# Patient Record
Sex: Male | Born: 1950 | Race: White | Hispanic: No | Smoking: Never smoker
Health system: Southern US, Community
[De-identification: ages and names within clinical notes are randomized; demographics above are authoritative.]

## PROBLEM LIST (undated history)

## (undated) DIAGNOSIS — I10 Essential (primary) hypertension: Secondary | ICD-10-CM

## (undated) DIAGNOSIS — E119 Type 2 diabetes mellitus without complications: Secondary | ICD-10-CM

## (undated) DIAGNOSIS — R609 Edema, unspecified: Secondary | ICD-10-CM

## (undated) DIAGNOSIS — H409 Unspecified glaucoma: Secondary | ICD-10-CM

## (undated) DIAGNOSIS — E7800 Pure hypercholesterolemia, unspecified: Secondary | ICD-10-CM

## (undated) DIAGNOSIS — G20 Parkinson's disease: Secondary | ICD-10-CM

## (undated) DIAGNOSIS — J449 Chronic obstructive pulmonary disease, unspecified: Secondary | ICD-10-CM

## (undated) DIAGNOSIS — B351 Tinea unguium: Secondary | ICD-10-CM

## (undated) DIAGNOSIS — K2970 Gastritis, unspecified, without bleeding: Secondary | ICD-10-CM

## (undated) HISTORY — DX: Parkinson's disease: G20

## (undated) HISTORY — DX: Tinea unguium: B35.1

## (undated) HISTORY — DX: Gastritis, unspecified, without bleeding: K29.70

## (undated) HISTORY — DX: Unspecified glaucoma: H40.9

## (undated) HISTORY — DX: Pure hypercholesterolemia, unspecified: E78.00

## (undated) HISTORY — DX: Edema, unspecified: R60.9

## (undated) HISTORY — PX: HIP SURGERY: SHX245

## (undated) HISTORY — PX: BACK SURGERY: SHX140

## (undated) HISTORY — DX: Essential (primary) hypertension: I10

## (undated) HISTORY — DX: Type 2 diabetes mellitus without complications: E11.9

## (undated) HISTORY — DX: Chronic obstructive pulmonary disease, unspecified: J44.9

---

## 2005-11-12 ENCOUNTER — Inpatient Hospital Stay: Payer: Self-pay | Admitting: Internal Medicine

## 2005-11-12 ENCOUNTER — Other Ambulatory Visit: Payer: Self-pay

## 2007-08-16 ENCOUNTER — Other Ambulatory Visit: Payer: Self-pay

## 2007-08-16 ENCOUNTER — Emergency Department: Payer: Self-pay | Admitting: Emergency Medicine

## 2010-05-11 ENCOUNTER — Ambulatory Visit: Payer: Self-pay | Admitting: Internal Medicine

## 2011-04-18 ENCOUNTER — Inpatient Hospital Stay: Payer: Self-pay | Admitting: Internal Medicine

## 2011-04-18 LAB — URINALYSIS, COMPLETE
Bacteria: NONE SEEN
Bilirubin,UR: NEGATIVE
Glucose,UR: NEGATIVE mg/dL (ref 0–75)
Ketone: NEGATIVE
Leukocyte Esterase: NEGATIVE
Nitrite: NEGATIVE
Ph: 5 (ref 4.5–8.0)
RBC,UR: <1 /HPF (ref 0–5)
Squamous Epithelial: NONE SEEN
WBC UR: NONE SEEN /HPF (ref 0–5)

## 2011-04-18 LAB — COMPREHENSIVE METABOLIC PANEL
Albumin: 3.8 g/dL (ref 3.4–5.0)
Alkaline Phosphatase: 44 U/L — ABNORMAL LOW (ref 50–136)
Anion Gap: 7 (ref 7–16)
BUN: 19 mg/dL — ABNORMAL HIGH (ref 7–18)
Calcium, Total: 9.3 mg/dL (ref 8.5–10.1)
Chloride: 98 mmol/L (ref 98–107)
Creatinine: 1.39 mg/dL — ABNORMAL HIGH (ref 0.60–1.30)
EGFR (African American): >60
EGFR (Non-African Amer.): 54 — ABNORMAL LOW
Glucose: 171 mg/dL — ABNORMAL HIGH (ref 65–99)
Osmolality: 282 (ref 275–301)
Sodium: 138 mmol/L (ref 136–145)
Total Protein: 7.7 g/dL (ref 6.4–8.2)

## 2011-04-18 LAB — CBC
HGB: 13.9 g/dL (ref 13.0–18.0)
MCH: 29.4 pg (ref 26.0–34.0)
MCV: 89 fL (ref 80–100)
Platelet: 126 10*3/uL — ABNORMAL LOW (ref 150–440)
RBC: 4.71 10*6/uL (ref 4.40–5.90)
WBC: 9 10*3/uL (ref 3.8–10.6)

## 2011-04-18 LAB — CK TOTAL AND CKMB (NOT AT ARMC)
CK, Total: 182 U/L (ref 35–232)
CK, Total: 165 U/L (ref 35–232)
CK-MB: 6.7 ng/mL — ABNORMAL HIGH (ref 0.5–3.6)
CK-MB: 5.4 ng/mL — ABNORMAL HIGH (ref 0.5–3.6)

## 2011-04-19 LAB — CBC WITH DIFFERENTIAL/PLATELET
Basophil #: 0 10*3/uL (ref 0.0–0.1)
Basophil %: 0.1 %
Eosinophil %: 4.7 %
HGB: 12.9 g/dL — ABNORMAL LOW (ref 13.0–18.0)
MCH: 29.3 pg (ref 26.0–34.0)
Monocyte #: 0.2 10*3/uL (ref 0.0–0.7)
Monocyte %: 2 %
Neutrophil %: 82.3 %
Platelet: 121 10*3/uL — ABNORMAL LOW (ref 150–440)
RBC: 4.4 10*6/uL (ref 4.40–5.90)

## 2011-04-19 LAB — BASIC METABOLIC PANEL
BUN: 20 mg/dL — ABNORMAL HIGH (ref 7–18)
Calcium, Total: 8.9 mg/dL (ref 8.5–10.1)
Chloride: 95 mmol/L — ABNORMAL LOW (ref 98–107)
Creatinine: 1.45 mg/dL — ABNORMAL HIGH (ref 0.60–1.30)
EGFR (African American): >60
EGFR (Non-African Amer.): 52 — ABNORMAL LOW
Potassium: 4 mmol/L (ref 3.5–5.1)
Sodium: 139 mmol/L (ref 136–145)

## 2011-04-19 LAB — LIPID PANEL
Ldl Cholesterol, Calc: 35 mg/dL (ref 0–100)
Triglycerides: 121 mg/dL (ref 0–200)
VLDL Cholesterol, Calc: 24 mg/dL (ref 5–40)

## 2011-04-19 LAB — CK TOTAL AND CKMB (NOT AT ARMC): CK-MB: 4.1 ng/mL — ABNORMAL HIGH (ref 0.5–3.6)

## 2011-04-19 LAB — TROPONIN I: Troponin-I: <0.02 ng/mL

## 2011-04-24 LAB — CULTURE, BLOOD (SINGLE)

## 2012-04-12 LAB — CBC
MCH: 29.6 pg (ref 26.0–34.0)
MCHC: 33.2 g/dL (ref 32.0–36.0)
MCV: 89 fL (ref 80–100)
Platelet: 198 10*3/uL (ref 150–440)
RBC: 4.62 10*6/uL (ref 4.40–5.90)
WBC: 14.7 10*3/uL — ABNORMAL HIGH (ref 3.8–10.6)

## 2012-04-12 LAB — COMPREHENSIVE METABOLIC PANEL
Alkaline Phosphatase: 113 U/L (ref 50–136)
BUN: 55 mg/dL — ABNORMAL HIGH (ref 7–18)
Bilirubin,Total: 1 mg/dL (ref 0.2–1.0)
Glucose: 145 mg/dL — ABNORMAL HIGH (ref 65–99)
Potassium: 3.8 mmol/L (ref 3.5–5.1)

## 2012-04-12 LAB — TROPONIN I: Troponin-I: <0.02 ng/mL

## 2012-04-12 LAB — CK TOTAL AND CKMB (NOT AT ARMC): CK, Total: 82 U/L (ref 35–232)

## 2012-04-13 ENCOUNTER — Inpatient Hospital Stay: Payer: Self-pay | Admitting: Internal Medicine

## 2012-04-13 LAB — URINALYSIS, COMPLETE
Glucose,UR: NEGATIVE mg/dL (ref 0–75)
Nitrite: NEGATIVE
Ph: 5 (ref 4.5–8.0)
Protein: NEGATIVE
RBC,UR: 1 /HPF (ref 0–5)

## 2012-04-13 LAB — LIPID PANEL
HDL Cholesterol: 18 mg/dL — ABNORMAL LOW (ref 40–60)
Triglycerides: 103 mg/dL (ref 0–200)
VLDL Cholesterol, Calc: 21 mg/dL (ref 5–40)

## 2012-04-13 LAB — SODIUM, URINE, RANDOM: Sodium, Urine Random: 81 mmol/L (ref 20–110)

## 2012-04-13 LAB — LIPASE, BLOOD: Lipase: 569 U/L — ABNORMAL HIGH (ref 73–393)

## 2012-04-14 LAB — BASIC METABOLIC PANEL
Anion Gap: 10 (ref 7–16)
Calcium, Total: 9.1 mg/dL (ref 8.5–10.1)
Chloride: 96 mmol/L — ABNORMAL LOW (ref 98–107)
Co2: 26 mmol/L (ref 21–32)
Creatinine: 2.91 mg/dL — ABNORMAL HIGH (ref 0.60–1.30)
EGFR (Non-African Amer.): 21 — ABNORMAL LOW
Glucose: 146 mg/dL — ABNORMAL HIGH (ref 65–99)
Osmolality: 282 (ref 275–301)
Potassium: 4.5 mmol/L (ref 3.5–5.1)

## 2012-04-14 LAB — CBC WITH DIFFERENTIAL/PLATELET
Basophil %: 0.2 %
Eosinophil %: 0.7 %
HCT: 37.9 % — ABNORMAL LOW (ref 40.0–52.0)
HGB: 12.2 g/dL — ABNORMAL LOW (ref 13.0–18.0)
Lymphocyte #: 0.7 10*3/uL — ABNORMAL LOW (ref 1.0–3.6)
MCH: 29.1 pg (ref 26.0–34.0)
MCV: 90 fL (ref 80–100)
Monocyte #: 1.1 x10 3/mm — ABNORMAL HIGH (ref 0.2–1.0)
Monocyte %: 6.8 %
Platelet: 214 10*3/uL (ref 150–440)
RDW: 15.9 % — ABNORMAL HIGH (ref 11.5–14.5)

## 2012-04-15 LAB — HEPATIC FUNCTION PANEL A (ARMC)
Albumin: 2.1 g/dL — ABNORMAL LOW (ref 3.4–5.0)
Bilirubin, Direct: 0.4 mg/dL — ABNORMAL HIGH (ref 0.00–0.20)
SGOT(AST): 18 U/L (ref 15–37)
SGPT (ALT): 16 U/L (ref 12–78)
Total Protein: 6.7 g/dL (ref 6.4–8.2)

## 2012-04-15 LAB — BASIC METABOLIC PANEL
Anion Gap: 8 (ref 7–16)
Calcium, Total: 9.1 mg/dL (ref 8.5–10.1)
Chloride: 101 mmol/L (ref 98–107)
Glucose: 144 mg/dL — ABNORMAL HIGH (ref 65–99)
Osmolality: 287 (ref 275–301)

## 2012-04-15 LAB — LIPASE, BLOOD: Lipase: 339 U/L (ref 73–393)

## 2012-04-15 LAB — CBC WITH DIFFERENTIAL/PLATELET
Basophil #: 0 10*3/uL (ref 0.0–0.1)
Basophil %: 0.2 %
HCT: 36 % — ABNORMAL LOW (ref 40.0–52.0)
Lymphocyte %: 4.3 %
MCH: 29.8 pg (ref 26.0–34.0)
RDW: 15.4 % — ABNORMAL HIGH (ref 11.5–14.5)

## 2012-04-16 LAB — CBC WITH DIFFERENTIAL/PLATELET
Basophil %: 1 %
Eosinophil #: 0.2 10*3/uL (ref 0.0–0.7)
HGB: 12 g/dL — ABNORMAL LOW (ref 13.0–18.0)
Lymphocyte #: 0.5 10*3/uL — ABNORMAL LOW (ref 1.0–3.6)
MCH: 29.2 pg (ref 26.0–34.0)
Monocyte #: 0.9 x10 3/mm (ref 0.2–1.0)
Neutrophil %: 86.4 %
RBC: 4.1 10*6/uL — ABNORMAL LOW (ref 4.40–5.90)
RDW: 15.3 % — ABNORMAL HIGH (ref 11.5–14.5)
WBC: 12.9 10*3/uL — ABNORMAL HIGH (ref 3.8–10.6)

## 2012-04-16 LAB — BASIC METABOLIC PANEL
Anion Gap: 6 — ABNORMAL LOW (ref 7–16)
BUN: 46 mg/dL — ABNORMAL HIGH (ref 7–18)
Chloride: 106 mmol/L (ref 98–107)
Creatinine: 1.78 mg/dL — ABNORMAL HIGH (ref 0.60–1.30)
EGFR (Non-African Amer.): 39 — ABNORMAL LOW
Glucose: 172 mg/dL — ABNORMAL HIGH (ref 65–99)
Osmolality: 292 (ref 275–301)
Potassium: 4.4 mmol/L (ref 3.5–5.1)
Sodium: 138 mmol/L (ref 136–145)

## 2012-04-17 LAB — AMMONIA: Ammonia, Plasma: 39 mcmol/L — ABNORMAL HIGH (ref 11–32)

## 2012-04-17 LAB — CBC WITH DIFFERENTIAL/PLATELET
Eosinophil #: 0.2 10*3/uL (ref 0.0–0.7)
Lymphocyte %: 5 %
MCV: 91 fL (ref 80–100)
Monocyte %: 7.6 %
Neutrophil #: 8.4 10*3/uL — ABNORMAL HIGH (ref 1.4–6.5)
Platelet: 221 10*3/uL (ref 150–440)
RDW: 15.3 % — ABNORMAL HIGH (ref 11.5–14.5)
WBC: 9.9 10*3/uL (ref 3.8–10.6)

## 2012-04-17 LAB — BASIC METABOLIC PANEL
Anion Gap: 6 — ABNORMAL LOW (ref 7–16)
Calcium, Total: 9.1 mg/dL (ref 8.5–10.1)
Chloride: 110 mmol/L — ABNORMAL HIGH (ref 98–107)
Co2: 26 mmol/L (ref 21–32)
Creatinine: 1.56 mg/dL — ABNORMAL HIGH (ref 0.60–1.30)
EGFR (African American): 52 — ABNORMAL LOW
EGFR (Non-African Amer.): 45 — ABNORMAL LOW
Glucose: 152 mg/dL — ABNORMAL HIGH (ref 65–99)
Osmolality: 295 (ref 275–301)
Potassium: 4.3 mmol/L (ref 3.5–5.1)
Sodium: 142 mmol/L (ref 136–145)

## 2012-04-18 LAB — BASIC METABOLIC PANEL
Anion Gap: 2 — ABNORMAL LOW (ref 7–16)
BUN: 28 mg/dL — ABNORMAL HIGH (ref 7–18)
Chloride: 108 mmol/L — ABNORMAL HIGH (ref 98–107)
Creatinine: 1.41 mg/dL — ABNORMAL HIGH (ref 0.60–1.30)
EGFR (African American): 59 — ABNORMAL LOW
EGFR (Non-African Amer.): 51 — ABNORMAL LOW
Glucose: 233 mg/dL — ABNORMAL HIGH (ref 65–99)
Osmolality: 294 (ref 275–301)

## 2012-04-19 LAB — BASIC METABOLIC PANEL
Anion Gap: 7 (ref 7–16)
BUN: 25 mg/dL — ABNORMAL HIGH (ref 7–18)
Chloride: 108 mmol/L — ABNORMAL HIGH (ref 98–107)
Co2: 29 mmol/L (ref 21–32)
EGFR (African American): 57 — ABNORMAL LOW
EGFR (Non-African Amer.): 49 — ABNORMAL LOW
Glucose: 184 mg/dL — ABNORMAL HIGH (ref 65–99)
Potassium: 3.8 mmol/L (ref 3.5–5.1)

## 2012-04-20 LAB — PLATELET COUNT: Platelet: 178 10*3/uL (ref 150–440)

## 2012-04-20 LAB — AMMONIA: Ammonia, Plasma: 39 mcmol/L — ABNORMAL HIGH (ref 11–32)

## 2012-04-21 LAB — BASIC METABOLIC PANEL
Anion Gap: 1 — ABNORMAL LOW (ref 7–16)
BUN: 21 mg/dL — ABNORMAL HIGH (ref 7–18)
Co2: 35 mmol/L — ABNORMAL HIGH (ref 21–32)
EGFR (Non-African Amer.): 59 — ABNORMAL LOW
Glucose: 122 mg/dL — ABNORMAL HIGH (ref 65–99)
Osmolality: 280 (ref 275–301)
Sodium: 138 mmol/L (ref 136–145)

## 2012-11-01 ENCOUNTER — Encounter: Payer: Self-pay | Admitting: *Deleted

## 2012-11-01 DIAGNOSIS — B351 Tinea unguium: Secondary | ICD-10-CM | POA: Insufficient documentation

## 2012-11-12 ENCOUNTER — Ambulatory Visit: Payer: Self-pay | Admitting: Podiatry

## 2013-07-07 IMAGING — US ABDOMEN ULTRASOUND LIMITED
1 series · 14 of 22 positions shown · non-contrast
Comparison: none

REASON FOR EXAM: abd pain renal failure and pancreatitis
COMMENTS:   Body Site: GB and Fossa, CBD, Head of Pancreas; Right Upper
Quad; Panc

PROCEDURE:     US  - US ABDOMEN LIMITED SURVEY  - April 12, 2012 [DATE]
RESULT:     Comparison: None
TECHNIQUE: Multiple gray-scale and color-flow Doppler images of the right
upper quadrant are presented for review.

[Series 1: abdomen ultrasound limited · 0.35mm/px · 14 of 22 slices shown]
[im 1/22]
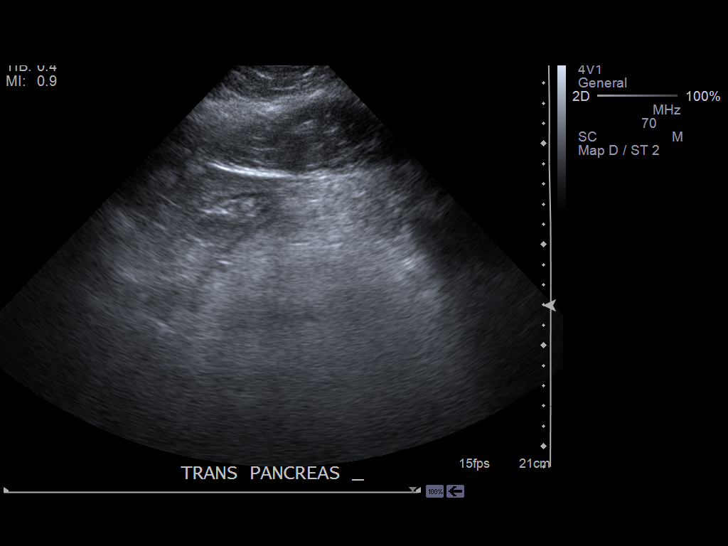
[im 3/22]
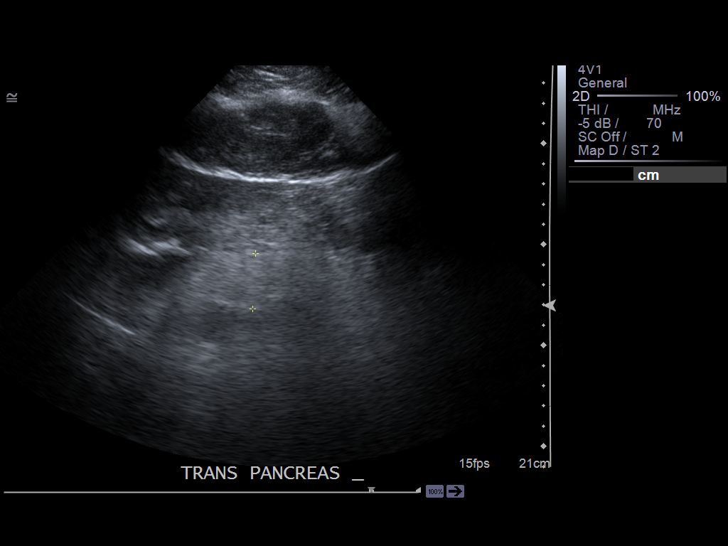
[im 4/22]
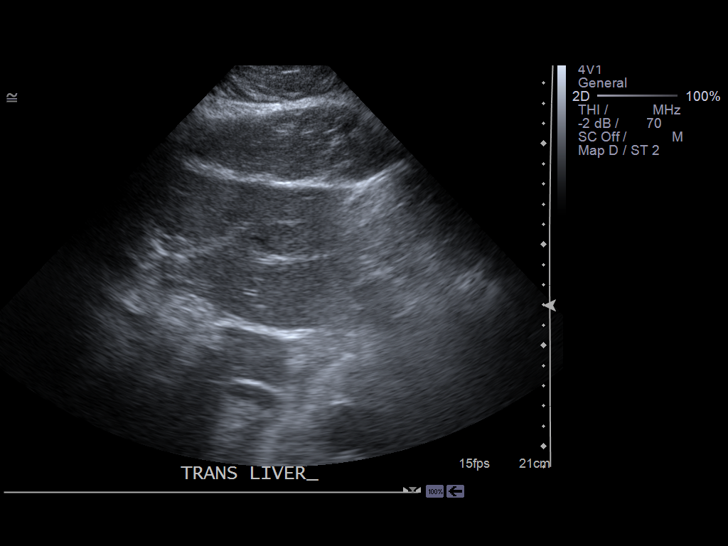
[im 6/22]
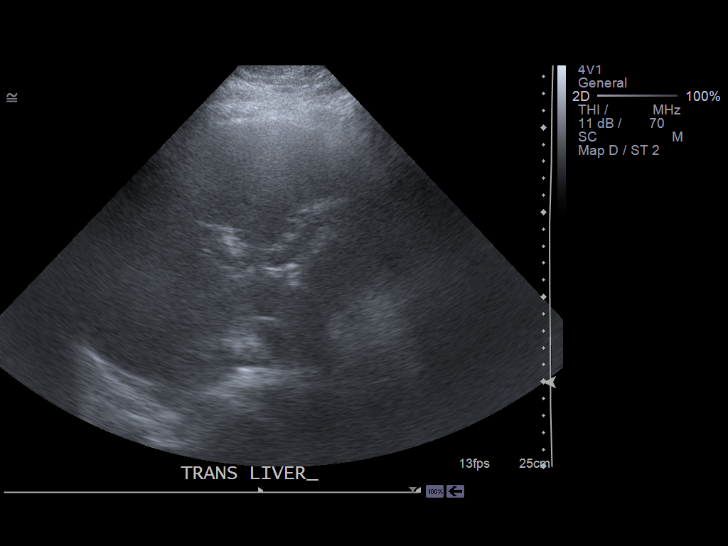
[im 8/22]
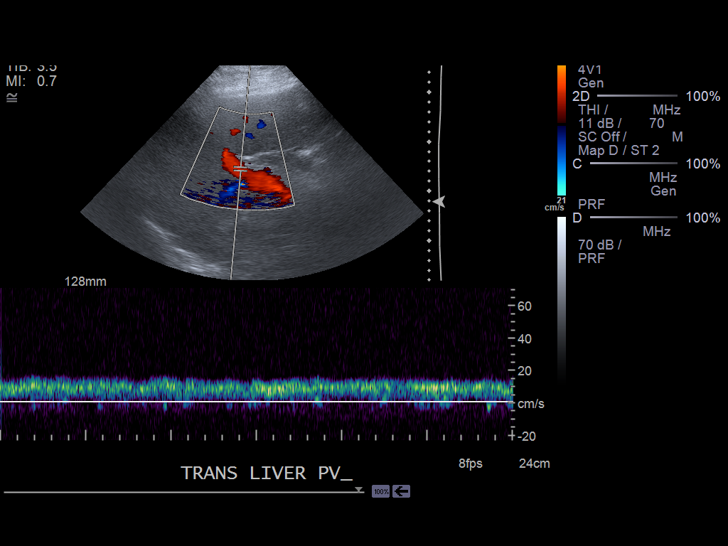
[im 9/22]
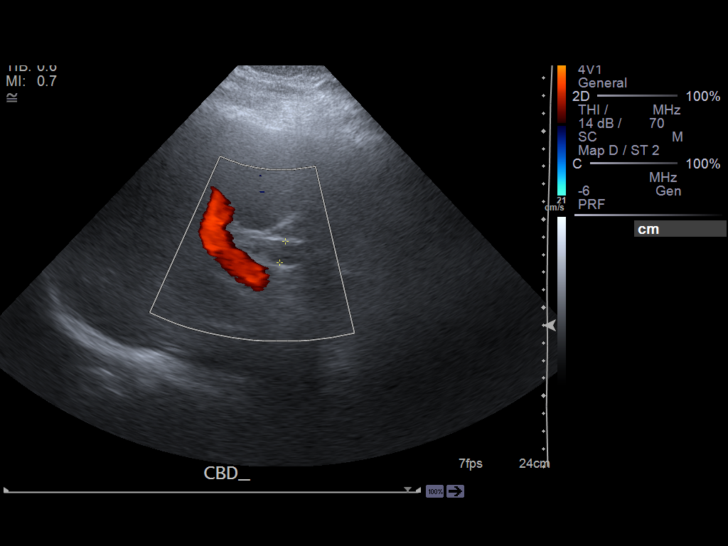
[im 11/22]
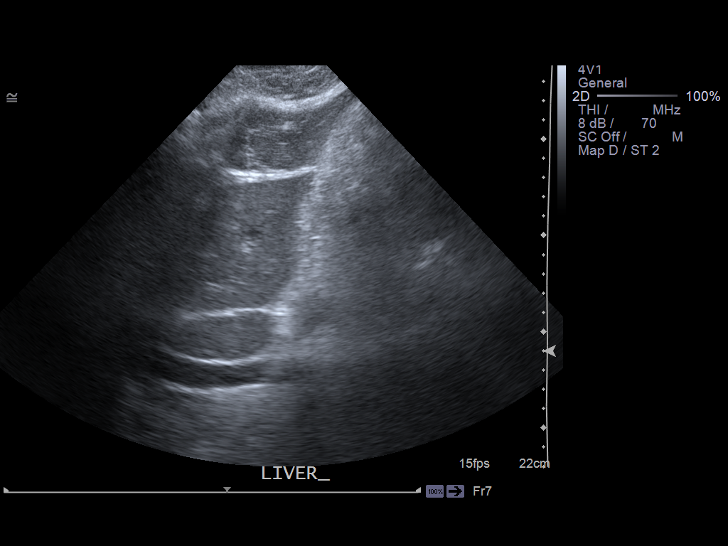
[im 12/22]
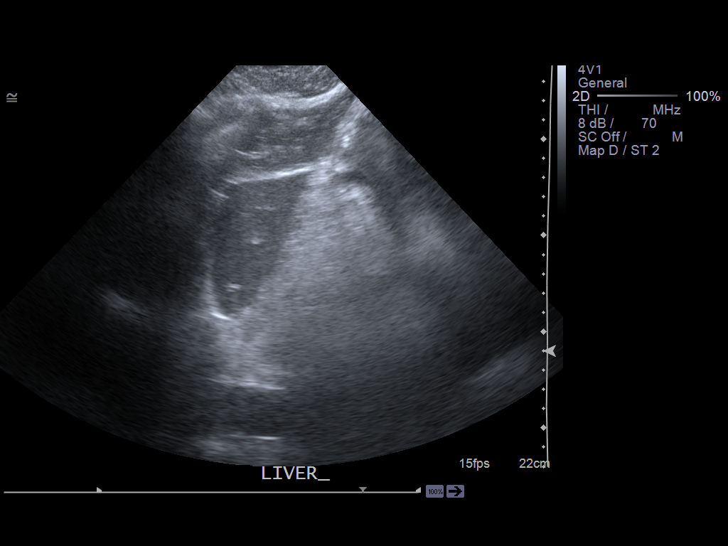
[im 14/22]
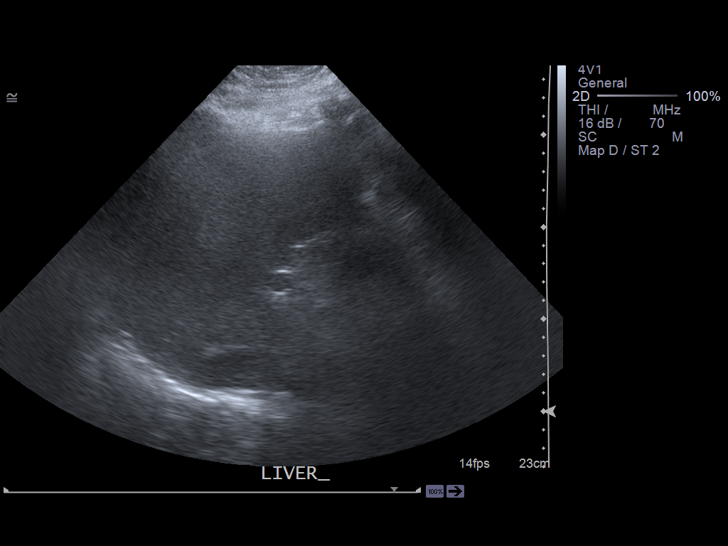
[im 15/22]
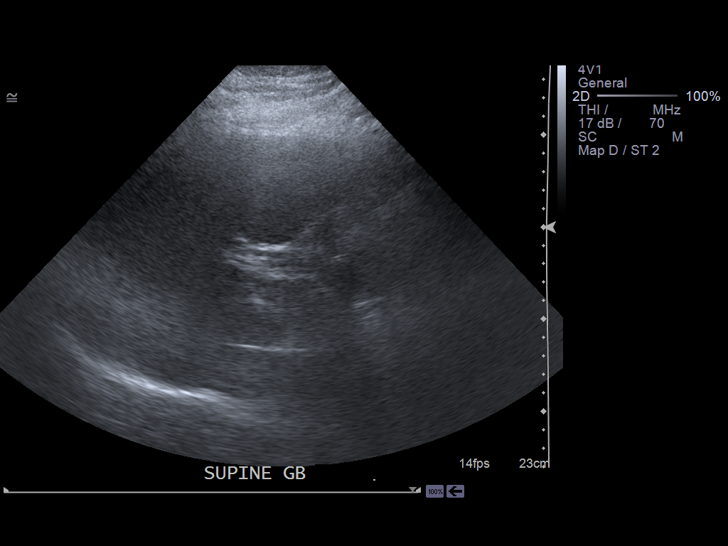
[im 17/22]
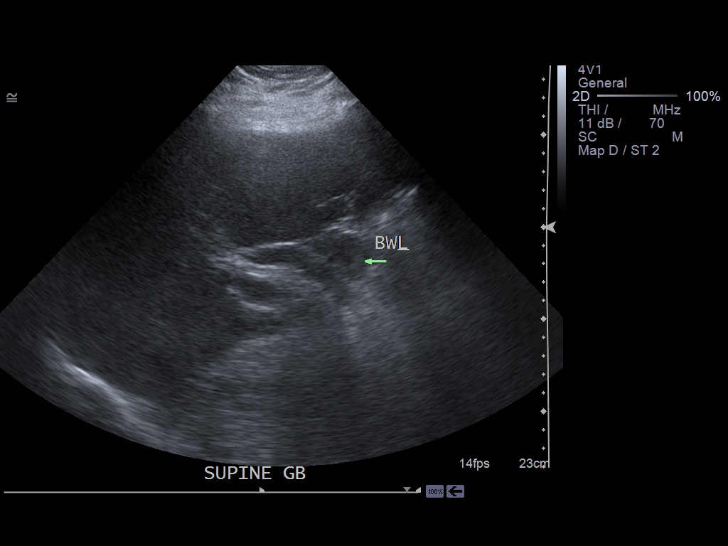
[im 19/22]
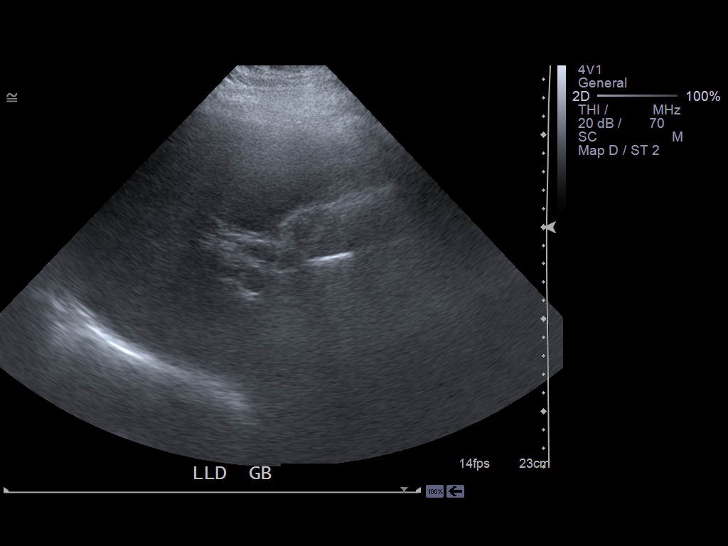
[im 20/22]
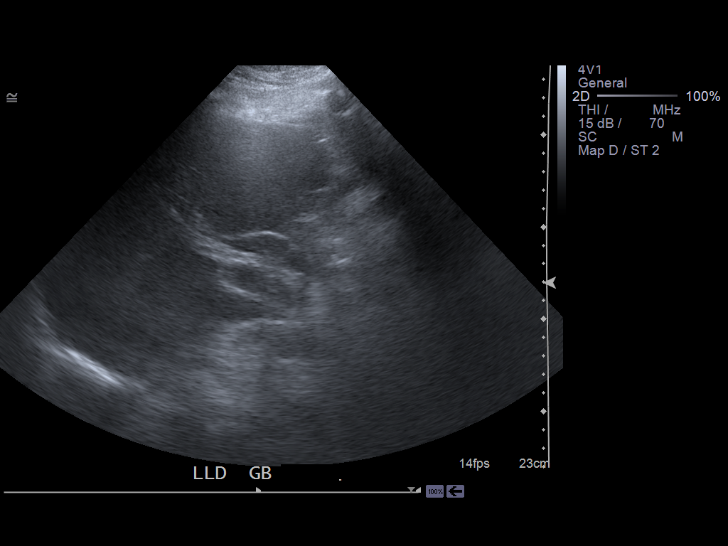
[im 22/22]
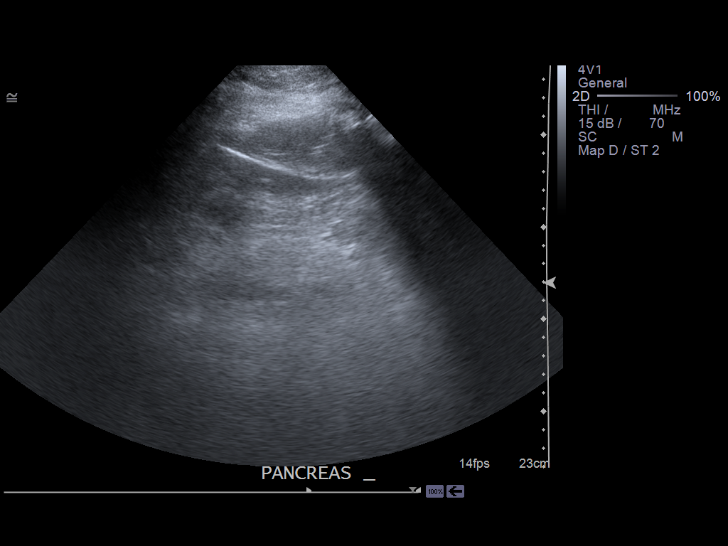

[14 of 22 positions shown; findings below may reference images not displayed]

FINDINGS: The liver is mildly increased in echogenicity as can be seen with hepatic
steatosis. The liver is without evidence of a focal hepatic lesion.

Lung visualized gallbladder. Patient states that he has had prior
cholecystectomy. There is no intrahepatic biliary ductal dilatation. The
common duct measures 12.5 mm in maximal diameter.

The was of the pancreas is increased in echogenicity as can be seen with
pancreatitis.
IMPRESSION: The was of the pancreas is increased in echogenicity as can be seen with
pancreatitis.

And dilated common bile duct which may be secondary to post cholecystectomy
state. A distally obstructing mass or lesion cannot be excluded. If there is
further clinical concern recommend M.R.C.P.

[REDACTED]

## 2013-07-12 IMAGING — CR DG CHEST 1V PORT
1 series · 1 of 1 positions shown · non-contrast
Comparison: none

REASON FOR EXAM: sob
COMMENTS:

[ap]
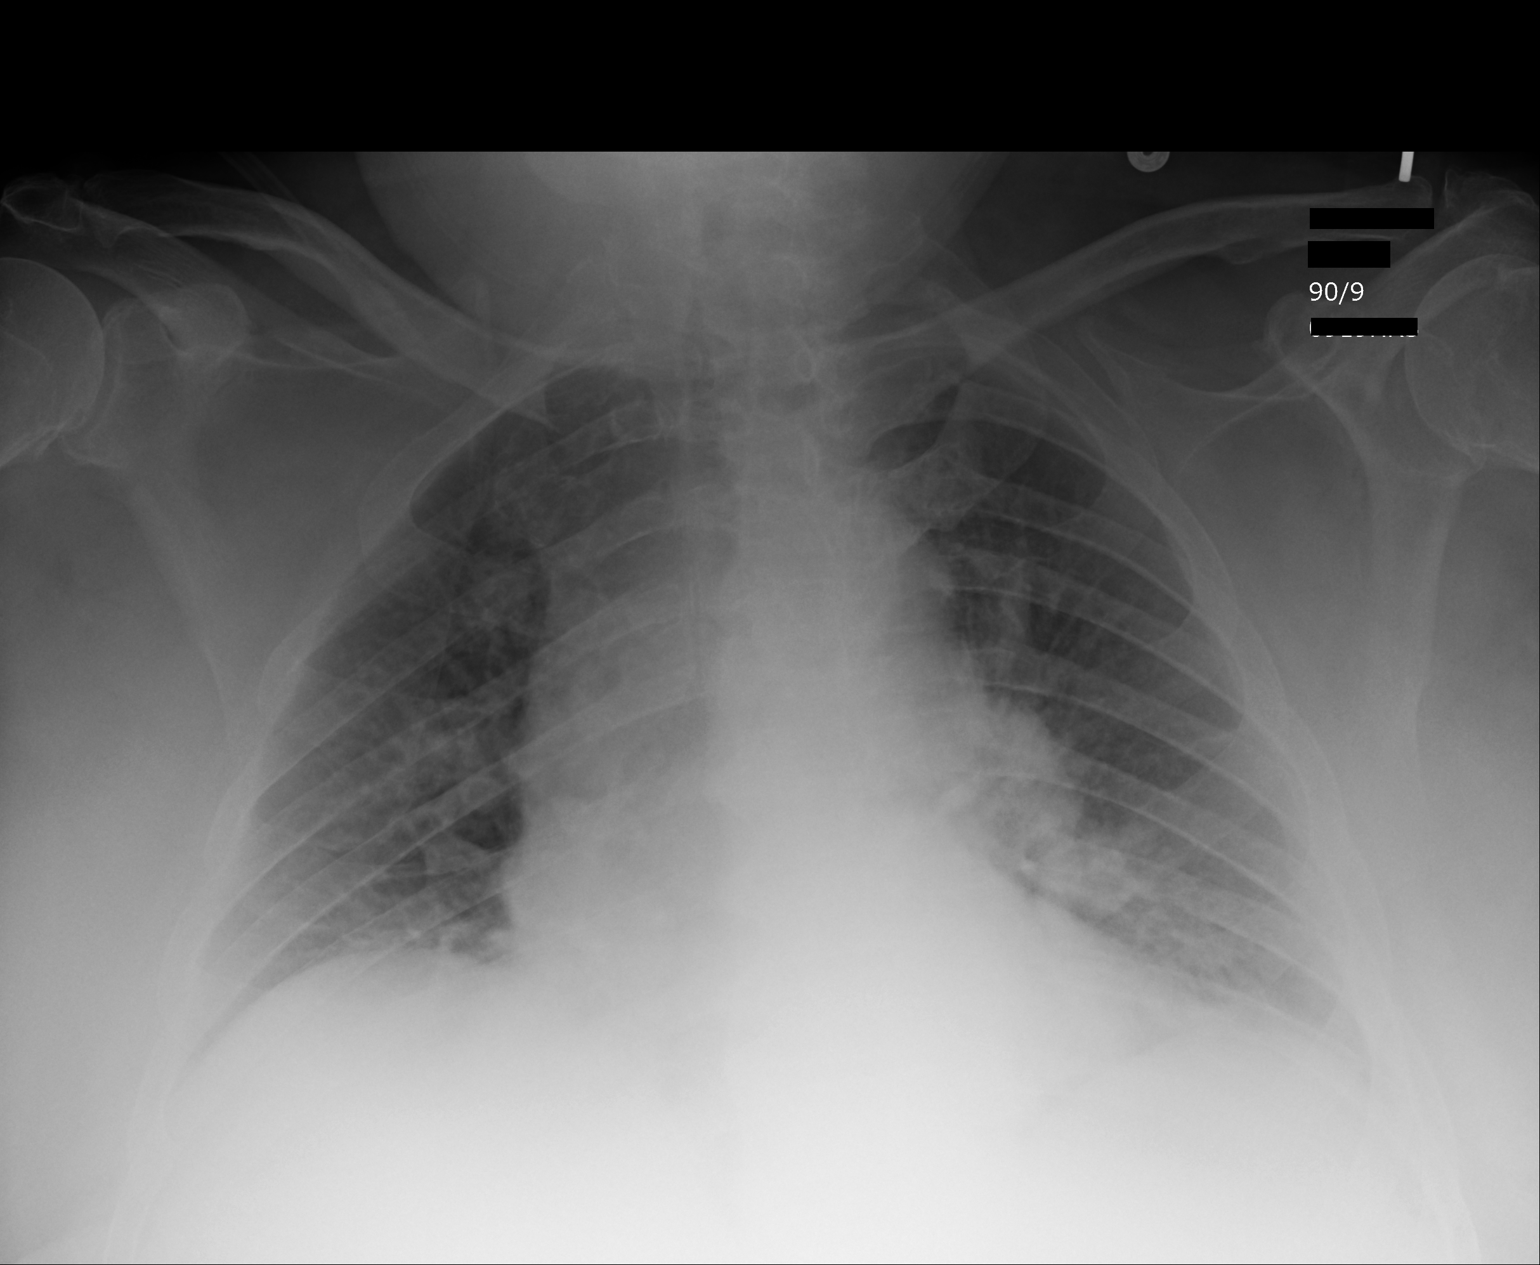

[1 of 1 positions shown; findings below may reference images not displayed]

PROCEDURE:     DXR - DXR PORTABLE CHEST SINGLE VIEW  - April 17, 2012  [DATE]

RESULT:     Comparison is made to the study 18 April, 2011. The projection
is somewhat lordotic. There is atelectasis at both lung bases with mild
interstitial prominence and cardiomegaly. There may be some chronic
interstitial lung disease. Mild interstitial edematous versus nonedematous
infiltrate is consideration. No large effusion is evident a trace effusions
may be present.
IMPRESSION: Interstitial prominence with bilateral lung base
atelectasis versus infiltrate with trace bilateral effusions. Mild
cardiomegaly.

[REDACTED]

## 2014-06-04 NOTE — Discharge Summary (Signed)
PATIENT NAME:  Zachary Douglas, Zachary Douglas MR#:  [id number] DATE OF BIRTH:  06/06/1946  DATE OF ADMISSION:  04/13/2012 DATE OF DISCHARGE:  04/20/2012  ADMITTING DIAGNOSES: 1. Acute pancreatitis.  2. Abdominal pain due to pancreatitis.   DISCHARGE DIAGNOSES: 1. Acute pancreatitis possibly related to Januvia, resolved.  2. Delirium, resolved.  3. Acute on chronic renal failure, resolved. 4. Chronic kidney disease, stage III.  5. Likely acute tubular necrosis during this admission, resolved.  6. Hyponatremia.  7. Dehydration.  8. Leukocytosis, resolved.  9. Anemia.  10. Left foot tarsalitis.   11. Questionable bacterial pneumonia being treated with Levaquin.  12. History of hypertension.  13. Hyperlipidemia.  14. Diabetes mellitus.  15. Chronic obstructive pulmonary disease.  16. Chronic respiratory failure on home oxygen at 2 liters of oxygen through nasal cannula continuously.  17. Chronic obstructive pulmonary disease.  18. Congestive heart failure.  19. Questionable obstructive sleep apnea.  20. Remote tobacco abuse.  21. Glaucoma and left eye blindness. 22. Generalized weakness.   DISCHARGE CONDITION: Stable.    DISCHARGE MEDICATIONS:  1. The patient is to continue Advair Diskus 250/50, 1 puff twice daily.  2. Isosorbide mononitrate 30 mg p.o. daily.  3. Alprazolam 0.25 mg p.o. daily as needed.  4. Travatan 0.004% ophthalmic solution 1 drop to each eye at suppertime.  5. Crestor 20 mg p.o. at bedtime.  6. Amantadine 100 mg p.o. twice daily.  7. Ranitidine 150 mg p.o. twice daily.  8. Etodolac 500 mg p.o. once daily.  9. Dorzolamide timolol ophthalmic solution 2.0%/0.5% 1 drop to each affected eye twice a day.  10. Acetaminophen 325 mg tablets 2 tablets every 6 hours as needed.  11. Enalapril 40 mg p.o. twice daily.  12. Nystatin topical powder to affected area 3 times daily.  13. Furosemide 20 mg p.o. once daily.  14. Senna 1 tablet once daily as needed.  15. Docusate sodium 100  mg p.o. twice daily.  16. Glipizide XL 5 mg p.o. daily.  17. Albuterol ipratropium solution 2.5/0.5 mg in 3 mL inhalation solution 4 times daily as needed.  18. Levofloxacin 500 mg p.o. daily for 7 more days.   HOME OXYGEN: 2 liters of oxygen through nasal cannula 24/7 with portable tank.   DIET: 2 grams salt, low fat, low cholesterol, carbohydrate-controlled diet, regular consistency.    REFERRALS: To physical therapy 2 to 7 times a week.   FOLLOW-UP APPOINTMENT: With Dr. Maryellen Pile in 2 days after discharge.   CONSULTANTS: Care Management, Dr. Mady Haagensen,  Dr. Marva Panda.   LABORATORY AND RADIOLOGICAL DATA:  CT scan of abdomen and pelvis without contrast 04/16/2012 showed findings raising concern of pancreatitis. Correlation with pancreatic function tests is recommended. There are also findings which may reflect a concomitant enteritis, likely reactive.  More ominous etiologies cannot be excluded, particularly as there are no clinical or laboratory findings consistent with pancreatitis. In the absence of clinical or laboratory findings of pancreatitis, further evaluation with an endoscopic ultrasound is recommended. Nodal findings at this time likely represent reactive lymph nodes.  The findings within the mesentery alternatively may represent lymphangitic spread of the inflammatory or possibly even neoplastic process if clinically warranted. Chest x-ray done on 04/17/2012 showed interstitial prominence with bilateral lung base atelectasis versus infiltrate and trace bilateral effusions. Mild cardiomegaly was also noted.   On admission, 04/12/2012, glucose was 145, BUN and creatinine were 55 and 3.09, sodium 127, bicarbonate level was 30. The patient's estimated GFR for non-African American would be 20.0.  The patient s lipase level was elevated at 1454. The patient's liver enzymes were unremarkable except an albumin level of 2.8. The patient's cardiac enzymes x 1 did not show any changes. The  patient's white blood cell count was elevated to 14.7, hemoglobin was 13.7, platelet count 198. Urinalysis revealed yellow hazy urine, negative for glucose, bilirubin or ketones, specific gravity 1.010, pH was 5.0, 1+ blood, negative for protein, nitrites or leukocyte esterase, 1 red blood cell, 3 white blood cells, trace bacteria, less than 1 epithelial cell and mucous was present. Urine cultures did not show any growth.   HISTORY AND PHYSICAL: The patient is a 64 year old Caucasian male with a history of diabetes, hypertension, hyperlipidemia who presented to the hospital on 04/13/2012 with complaints of abdominal pain. Please refer to Dr. Teena IraniElgergawy's admission note on 04/13/2012. On arrival to the hospital, the patient's vitals showed that his pulse was 79, respiratory rate was 18, blood pressure 127/61, saturation was 99% on oxygen therapy. Physical exam revealed some tenderness in the abdomen diffusely but more pronounced in the epigastric area.  Otherwise, the patient also had lower extremity edema but no other significant findings.   HOSPITAL COURSE: The patient was admitted to the hospital for further evaluation. He was started on IV fluids. He was initiated on pain medications for acute pancreatitis. He was seen by Dr. Marva PandaSkulskie already on the same day of admission, 04/13/2012. According to Dr. Marva PandaSkulskie, who saw the patient during that time, he recommended to get an MRCP and follow lipase levels.  The patient's MRCP was scheduled, however, not performed due to the patient's weight.  Dr. Marva PandaSkulskie then recommended CT scan of abdomen with pancreatic protocol; however, since creatinine was very high, he recommended to wait until creatinine level is lower. The patient was started on diet, and he improved significantly. With conservative management, his condition improved. It was unclear why the patient developed pancreatitis in the first place. However, Januvia was implicated, and the patient's Januvia was  stopped.   In regards to hyponatremia, acute on chronic renal failure, the patient was given IV fluids and his kidney function improved to normal levels; by 04/21/2012, the patient's creatinine is 1.26, and his BUN level is only 21, which makes estimated GFR for non-African American would be 59.0. The patient was evaluated by Dr. Mady HaagensenMunsoor Lateef, who followed the patient along and felt that the patient's acute on chronic renal failure was likely related to acute tubular necrosis, but his CKD most likely is due to diabetic nephropathy.  He recommended to avoid nephrotoxins as well as contrast medications and follow up with Dr. Cherylann RatelLateef in the office.   The patient was noted to have some redness and inflammation of his left foot middle toe. He was started on antibiotic therapy, initially with Augmentin; however, later on when the patient's chest x-ray came back positive for possible pneumonia versus atelectasis, a decision was made to change his antibiotic to Levaquin. The patient is to continue Levaquin for 7 more days to complete course,   For diabetes mellitus, the patient's hemoglobin A1c was unfortunately not checked. The patient's Januvia as well as metformin were discontinued. Januvia was discontinued due to pancreatitis, however, metformin was discontinued due to chronic renal failure. The patient was started on glipizide, which was advanced to current levels. It is recommended to follow the patient's glucose levels as well as hemoglobin A1c and make decisions about advancing glipizide or adding insulin depending on the patient's needs.   In regards to hyponatremia,  initially the patient was hyponatremic. He was, however, fluid overloaded overall; but with IV fluid administration the patient's sodium level normalized. By 04/16/2012, the patient's sodium level is around 138 to 142 and remained stable during all his stay in the hospital time. It was felt that the patient's hyponatremia was very likely  related to dehydration as it improved with IV fluid administration.   In regards to leukocytosis, on arrival to the hospital, the patient's white blood cell count was elevated to 15.7 on 04/14/2012. With antibiotic therapy, the patient's white blood cell count normalized already by 04/17/2012.   For history of hypertension, the patient's blood pressure medications initially were placed on hold. However, later on Vasotec was renewed. The patient's blood pressure is well controlled today on the day of discharge. On the day of discharge, the patient's temperature is 98, pulse was 80, respiration rate was 20, blood pressure 120/71, saturation was 94% on 2 liters of oxygen through nasal cannula at rest.   In regards to generalized weakness, the patient was noted to be very weak, and the physical therapist recommended rehabilitation placement. This was discussed with the patient's sister, who was in agreement.  They will find a facility suitable for the patient, and very likely we will be discharging the patient today on 04/21/2012.   For history of chronic obstructive pulmonary disease, chronic respiratory failure, the patient is to continue his outpatient management. The patient would benefit from evaluation for possible obstructive sleep apnea and fitting of CPAP.  Meanwhile, he is to continue oxygen therapy. The patient's sister has been informed about possible sleep study as outpatient.   In regards to glaucoma, the patient is to continue his current management for glaucoma.   The patient is being discharged in stable condition with the above-mentioned medications and follow-up.   TIME SPENT: 40 minutes.   ____________________________ Katharina Caper, MD rv:cb D: 04/21/2012 10:19:00 ET T: 04/21/2012 11:05:53 ET JOB#: [id number]  cc: Katharina Caper, MD, <Dictator> Serita Sheller. Maryellen Pile, MD Katharina Caper MD ELECTRONICALLY SIGNED 05/06/2012 15:13

## 2014-06-04 NOTE — Consult Note (Signed)
Chief Complaint:  Subjective/Chief Complaint seen for pancreatitis.  continuing with generalized abdominal discomfort, no n or vomiting, on ice chips. denies passing flatus or bm.   VITAL SIGNS/ANCILLARY NOTES: **Vital Signs.:   04-Mar-14 13:40  Vital Signs Type Routine  Temperature Temperature (F) 97.5  Celsius 36.3  Temperature Source oral  Pulse Pulse 74  Respirations Respirations 18  Systolic BP Systolic BP 716  Diastolic BP (mmHg) Diastolic BP (mmHg) 73  Mean BP 91  Pulse Ox % Pulse Ox % 95  Pulse Ox Activity Level  At rest  Oxygen Delivery 2L   Brief Assessment:  Cardiac Regular   Respiratory clear BS   Gastrointestinal details normal No rebound tenderness  obese diffusely tender, bs positive, positive distension.   Lab Results:  Hepatic:  04-Mar-14 04:29   Bilirubin, Total 0.8  Bilirubin, Direct  0.4 (Result(s) reported on 15 Apr 2012 at 07:47AM.)  Alkaline Phosphatase 97  SGPT (ALT) 16  SGOT (AST) 18  Total Protein, Serum 6.7  Albumin, Serum  2.1  Routine Chem:  01-Mar-14 16:38   Lipase  1454 (Result(s) reported on 12 Apr 2012 at 05:05PM.)  02-Mar-14 18:52   Lipase  569 (Result(s) reported on 13 Apr 2012 at 07:13PM.)  03-Mar-14 04:46   Lipase  459 (Result(s) reported on 14 Apr 2012 at 05:11AM.)  04-Mar-14 04:29   Lipase 339 (Result(s) reported on 15 Apr 2012 at 06:15AM.)  Glucose, Serum  144  BUN  57  Creatinine (comp)  2.79  Sodium, Serum  134  Potassium, Serum 4.5  Chloride, Serum 101  CO2, Serum 25  Calcium (Total), Serum 9.1  Anion Gap 8  Osmolality (calc) 287  eGFR (African American)  26  eGFR (Non-African American)  22 (eGFR values <6m/min/1.73 m2 may be an indication of chronic kidney disease (CKD). Calculated eGFR is useful in patients with stable renal function. The eGFR calculation will not be reliable in acutely ill patients when serum creatinine is changing rapidly. It is not useful in  patients on dialysis. The eGFR calculation may  not be applicable to patients at the low and high extremes of body sizes, pregnant women, and vegetarians.)  Routine Hem:  04-Mar-14 04:29   WBC (CBC)  14.2  RBC (CBC)  3.98  Hemoglobin (CBC)  11.9  Hematocrit (CBC)  36.0  Platelet Count (CBC) 235  MCV 91  MCH 29.8  MCHC 32.9  RDW  15.4  Neutrophil % 87.5  Lymphocyte % 4.3  Monocyte % 6.8  Eosinophil % 1.2  Basophil % 0.2  Neutrophil #  12.5  Lymphocyte #  0.6  Monocyte # 1.0  Eosinophil # 0.2  Basophil # 0.0 (Result(s) reported on 15 Apr 2012 at 06:15AM.)   Radiology Results: [place]    01-Mar-14 22:00, [place]   REASON FOR EXAM:    abd pain renal failure and pancreatitis  COMMENTS:   Body Site: GB and Fossa, CBD, Head of Pancreas; Right Upper   Quad; Panc    PROCEDURE: [place] - [place] SURVE[place]  - Apr 12 2012 10:00PM     RESULT: Comparison: None    Technique: Multiple gray-scale and color-flow Doppler images of the right   upper quadrant are presented for review.    Findings:    The liver is mildly increased in echogenicity as can be seen with hepatic   steatosis. The liver is without evidence of afocal hepatic lesion.   Lung visualized  gallbladder. Patient states that he has had prior   cholecystectomy. There is no intrahepatic biliary ductal dilatation. The   common duct measures 12.5 mm in maximal diameter.    The was of the pancreas is increased in echogenicity as can be seen with   pancreatitis.    IMPRESSION:     The was of the pancreas is increased in echogenicity as can be seen with   pancreatitis.     And dilated common bile duct which may be secondary to post   cholecystectomy state. A distally obstructing mass or lesion cannot be   excluded. If there is further clinical concern recommend M.R.C.P.  Dictation Site: 1        Verified By: Jennette Banker, M.D., MD   Assessment/Plan:  Assessment/Plan:  Assessment 1) acute pancreatitis recurrent-  lipase normalized, continueng with some abdominal distension and generalized abdominal pain.  Although the cbd is dilated, there is no lab evidence to show obstruction.  Concern for other etiology of abdominal pain.   Plan 1) unable to do MRCP due to patient size, unable to do iv contrasted ct of abdomen due to poor renal fnx.  Will order ct of abd and pelvis with po contrast for tomorrow.  will allow some limited clears.   Electronic Signatures: Loistine Simas (MD)  (Signed 04-Mar-14 21:08)  Authored: Chief Complaint, VITAL SIGNS/ANCILLARY NOTES, Brief Assessment, Lab Results, Radiology Results, Assessment/Plan   Last Updated: 04-Mar-14 21:08 by Loistine Simas (MD)

## 2014-06-04 NOTE — Consult Note (Signed)
Chief Complaint:  Subjective/Chief Complaint sleeping ealily aroused, seen for pancreatitis.  tolerating regular diet.  states he still has abdominal pain. no n/v.   VITAL SIGNS/ANCILLARY NOTES: **Vital Signs.:   06-Mar-14 14:52  Vital Signs Type Routine  Temperature Temperature (F) 97.5  Celsius 36.3  Temperature Source oral  Pulse Pulse 80  Respirations Respirations 18  Systolic BP Systolic BP 915  Diastolic BP (mmHg) Diastolic BP (mmHg) 80  Mean BP 110  Pulse Ox % Pulse Ox % 90  Pulse Ox Activity Level  At rest  Oxygen Delivery 2L  *Intake and Output.:   06-Mar-14 08:20  Stool  large amount loose brown stool   Brief Assessment:  Cardiac Regular   Respiratory clear BS   Gastrointestinal details normal Soft  Nontender  Nondistended  No masses palpable  Bowel sounds normal  No rebound tenderness   Lab Results: Lab:  06-Mar-14 11:35   pH (ABG)  7.27  PCO2  58  PO2  73  FiO2 28  Base Excess -1.4  HCO3  26.6  O2 Saturation 94.5  Specimen Site (ABG) RT RADIAL  Patient Temp (ABG) 37.0 (Result(s) reported on 17 Apr 2012 at 11:41AM.)  Routine Chem:  06-Mar-14 04:40   Glucose, Serum  152  BUN  38  Creatinine (comp)  1.56  Sodium, Serum 142  Potassium, Serum 4.3  Chloride, Serum  110  CO2, Serum 26  Calcium (Total), Serum 9.1  Anion Gap  6  Osmolality (calc) 295  eGFR (African American)  52  eGFR (Non-African American)  45 (eGFR values <2m/min/1.73 m2 may be an indication of chronic kidney disease (CKD). Calculated eGFR is useful in patients with stable renal function. The eGFR calculation will not be reliable in acutely ill patients when serum creatinine is changing rapidly. It is not useful in  patients on dialysis. The eGFR calculation may not be applicable to patients at the low and high extremes of body sizes, pregnant women, and vegetarians.)    11:17   Ammonia, Plasma  39 (Result(s) reported on 17 Apr 2012 at 12:24PM.)  Routine Hem:  06-Mar-14 04:40    WBC (CBC) 9.9  RBC (CBC)  3.95  Hemoglobin (CBC)  11.6  Hematocrit (CBC)  35.7  Platelet Count (CBC) 221  MCV 91  MCH 29.5  MCHC 32.6  RDW  15.3  Neutrophil % 85.1  Lymphocyte % 5.0  Monocyte % 7.6  Eosinophil % 2.0  Basophil % 0.3  Neutrophil #  8.4  Lymphocyte #  0.5  Monocyte # 0.7  Eosinophil # 0.2  Basophil # 0.0 (Result(s) reported on 17 Apr 2012 at 06:11AM.)   Assessment/Plan:  Assessment/Plan:  Assessment 1) pancreatitis-much improved, no pain on exam, normal labs.  tolerating po.   Plan 1) will need repeat ct in 3-4 months and gi fu as o/p in 3 weeks or so.  Will sign off, reconsult as needed.   Electronic Signatures: SLoistine Simas(MD)  (Signed 06-Mar-14 16:57)  Authored: Chief Complaint, VITAL SIGNS/ANCILLARY NOTES, Brief Assessment, Lab Results, Assessment/Plan   Last Updated: 06-Mar-14 16:57 by SLoistine Simas(MD)

## 2014-06-04 NOTE — Consult Note (Signed)
DATE OF BIRTH:  06/06/1946  DATE OF CONSULTATION:  04/13/2012  CONSULTING PHYSICIAN:  Christena DeemMartin U. Amir Glaus, MD  PATIENT OF DR. Randol KernELGERGAWY  REASON FOR CONSULTATION: Abdominal pain, pancreatitis.   HISTORY OF PRESENT ILLNESS: Mr. Zachary Douglas is a 64 year old male who presented to the Emergency Room earlier today with abdominal pain, evaluation showing he had pancreatitis. He states that he has been having problems with epigastric and central abdominal pain for over 2 weeks. There has been some nausea, but no vomiting. He states that prior to this, he did apparently have an episode of pancreatitis several years ago. In review of the chart, he did have an abdominal ultrasound in 2007. I have found variable reports that he has and has not had his gallbladder removed. However, patient denies having had his gallbladder removed. He had an abdominal ultrasound done on this hospitalization showing fatty liver as well as an echogenic pancreas. The common bile duct was 12.5 mm in dimension, and I infer from the report that he is post-cholecystectomy. He states that currently his pain is 6/10, it was worse yesterday. In review, his common bile duct dimension on 11/15/2005 was 1.0 cm, that is 10 mm. He states he generally has a bowel movement every day, but recently has been having some problems with constipation. He gets some occasional heartburn, but no dysphagia. In review, patient is not currently an alcohol drinker, although 25 years ago he drank some, but never heavily.   PAST MEDICAL HISTORY: 1.  Hypertension.  2.  Type 2 diabetes.  3.  Hypercholesterolemia.  4.  Chronic respiratory failure, on oxygen at home.  5.  COPD.   6.  History of congestive heart failure.  7.  Obstructive sleep apnea.  8.  Previous tobacco use.  9. 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 He is currently treated in the opposite eye for glaucoma.  10.  History of cardiomegaly.  11.  Possibly diuretic induced  pancreatitis in 2002.   PAST SURGICAL HISTORY INCLUDES:  1.  Total knee replacement.  2.  Back surgeries.  3.  Left eye surgery.  4.  Hip replacement in 1999.  5. Appendectomy.  6.  Again, the ultrasound states that he looks like he is post-cholecystectomy. However, patient denies having had his gallbladder out.   FAMILY HISTORY:  GI family history is negative for colorectal cancer, liver disease or ulcers.   SOCIAL HISTORY:  No current alcohol use. No current tobacco use. No illicit drug use.   REVIEW OF SYSTEMS:  Ten systems reviewed per admission history and physical, agree with same.   OUTPATIENT MEDICATIONS INCLUDE:  Advair Diskus 250/50, 1 puff twice a day. Alprazolam 0.25 mg once a day as needed. Amantadine 100 mg 1 capsule twice a day. Crestor 20 mg once a day. Dorzolamide-Timolol ophthalmic solution 1 drop to each affected eye twice a day. Etodolac 500 mg once a day. Exforge 5/320 mg once a day. Furosemide 40 mg once a day. Isosorbide mononitrate 30 mg once a day. Janumet XR 1000/50 mg once a day. Januvia 100 mg once a day. Ranitidine 150 mg tablet twice a day. Travatan eye drops 0.004 ophthalmic solution 1 drop each eye once a day.   PHYSICAL EXAMINATION: VITAL SIGNS:  Temperature is 98.6, pulse 101, respirations 20, blood pressure 121/66, pulse ox 94%.  GENERAL:  He is a 64 year old male, no acute distress.  HEENT: Normocephalic, atraumatic.  Eyes are anicteric. The left eye is injected and erythematous, it is nontender.  Nose:  Septum midline.  Oropharynx:  No lesions.  NECK:  No JVD.  HEART:  Regular rate and rhythm.  LUNGS:  Clear.  ABDOMEN: Obese. Tender to palpation in the right upper quadrant, epigastrium and extending into the left upper quadrant. There are no masses or rebound. Bowel sounds are decreased, but positive. The patient states that he has not been passing any gas today. There is some mild to moderate distention.  RECTAL:  Anorectal exam deferred.  EXTREMITIES:   Show 1+ lower extremity edema.    LABS INCLUDE THE FOLLOWING:  On admission to the hospital, he had a glucose of 145, BUN 55, creatinine 3.09, sodium 127, potassium 3.8, chloride 90, CO2 of 30, calcium 9.2. Lipase was 1454. He had serum triglycerides of 103. A repeat sodium was 130. His hepatic profile late yesterday showed an albumin of 2.8, otherwise normal profile. One set of cardiac enzymes showed a slight elevation of CPKMB at 6.4. His troponin I was less than 0.02. Hemogram showed a white count of 14.7, H and H of 13.7/41.2, platelet count of 198. Urinalysis showed negative leukocyte esterase, negative nitrite.   He did have an abdominal ultrasound showing the pancreas increased in echogenicity, dilated common bile duct as noted above, distally obstructing mass or lesion could not be excluded. He also had a renal ultrasound that was normal.   ASSESSMENT:  1. Acute/recurrent pancreatitis, etiology uncertain. He does show evidence of previous cholecystectomy with a dilated common bile duct, but there was no evidence of gallstones themselves. His triglycerides are normal. Agree with recommendations to proceed with MRCP.  2.  Renal insufficiency, likely chronic in nature. Will need to follow this carefully, in addition to his fluid status in regards to the pancreatitis. Will recheck a lipase tonight. The patient is feeling a little bit better, although he is not yet passing flatus.  Will follow with you.    ____________________________ Christena Deem, MD mus:mr D: 04/13/2012 18:05:00 ET T: 04/13/2012 18:38:07 ET JOB#: [id number]  cc: Christena Deem, MD, <Dictator> Christena Deem MD ELECTRONICALLY SIGNED 04/30/2012 18:21

## 2014-06-04 NOTE — Consult Note (Signed)
Chief Complaint:  Subjective/Chief Complaint Zachary Douglas has been sedated for agitation.  not completely arousable.   VITAL SIGNS/ANCILLARY NOTES: **Vital Signs.:   05-Mar-14 14:10  Vital Signs Type Routine  Temperature Temperature (F) 97.7  Celsius 36.5  Temperature Source oral  Pulse Pulse 81  Respirations Respirations 18  Systolic BP Systolic BP 401  Diastolic BP (mmHg) Diastolic BP (mmHg) 80  Mean BP 105  Pulse Ox % Pulse Ox % 93  *Intake and Output.:   05-Mar-14 09:27  Stool  extra large soft bm    10:15  Stool  large soft loose brown    10:26  Stool  soft brown loose   Brief Assessment:  Cardiac Regular   Respiratory occasional coarse wheeze bilateral   Gastrointestinal details normal Soft  Bowel sounds normal  obese, apparently non tender to palpation.   Lab Results: Routine Chem:  05-Mar-14 12:32   Glucose, Serum  172  BUN  46  Creatinine (comp)  1.78  Sodium, Serum 138  Potassium, Serum 4.4  Chloride, Serum 106  CO2, Serum 26  Calcium (Total), Serum 9.0  Anion Gap  6  Osmolality (calc) 292  eGFR (African American)  45  eGFR (Non-African American)  39 (eGFR values <52m/min/1.73 m2 may be an indication of chronic kidney disease (CKD). Calculated eGFR is useful in patients with stable renal function. The eGFR calculation will not be reliable in acutely ill patients when serum creatinine is changing rapidly. It is not useful in  patients on dialysis. The eGFR calculation may not be applicable to patients at the low and high extremes of body sizes, pregnant women, and vegetarians.)  Routine Hem:  05-Mar-14 12:32   WBC (CBC)  12.9  RBC (CBC)  4.10  Hemoglobin (CBC)  12.0  Hematocrit (CBC)  37.0  Platelet Count (CBC) 235  MCV 90  MCH 29.2  MCHC 32.3  RDW  15.3  Neutrophil % 86.4  Lymphocyte % 4.1  Monocyte % 6.9  Eosinophil % 1.6  Basophil % 1.0  Neutrophil #  11.1  Lymphocyte #  0.5  Monocyte # 0.9  Eosinophil # 0.2  Basophil # 0.1 (Result(s)  reported on 16 Apr 2012 at 12:50PM.)   Radiology Results: [place]    01-Mar-14 22:00, [place]  [place]   REASON FOR EXAM:    abd pain renal failure and pancreatitis  COMMENTS:   Body Site: GB and Fossa, CBD, Head of Pancreas; Right Upper   Quad; Panc    PROCEDURE: [place] - [place]  - Apr 12 2012 10:00PM     RESULT: Comparison: None    Technique: Multiple gray-scale and color-flow Doppler images of the right   upper quadrant are presented for review.    Findings:    The liver is mildly increased in echogenicity as can be seen with hepatic   steatosis. The liver is without evidence of afocal hepatic lesion.   Lung visualized gallbladder. Patient states that he has had prior   cholecystectomy. There is no intrahepatic biliary ductal dilatation. The   common duct measures 12.5 mm in maximal diameter.    The was of the pancreas is increased in echogenicity as can be seen with   pancreatitis.    IMPRESSION:     The was of the pancreas is increased in echogenicity as can be seen with   pancreatitis.     And dilated common bile duct which may be secondary to post  cholecystectomy state. A distally obstructing mass or lesion cannot be   excluded. If there is further clinical concern recommend M.R.C.P.  Dictation Site: 1        Verified By: Jennette Banker, M.D., MD  CT:    05-Mar-14 11:02, CT Abdomen and Pelvis Without Contrast  CT Abdomen and Pelvis Without Contrast   REASON FOR EXAM:    (1) abdominal pain; (2) abdominal pain;    NOTE:   Nursing to Give Oral CT [place]  COMMENTS:       PROCEDURE: CT  - CT ABDOMEN AND PELVIS W0  - Apr 16 2012 11:02AM     RESULT:     Technique: Noncontrasted 3 mm sections were obtained from the lung bases   through the pubic symphysis. The patient received oral contrast.    Findings: Ill-defined areas of increased density project within the   posterior aspect of the lung bases. Differential  considerations are   atelectasis versus infiltrate.  Noncontrast evaluation of the liver, spleen, adrenals, and kidneys is   unremarkable.    Evaluation of the pancreas demonstrates inflammatory change within the   surrounding peripancreatic fat anteriorly along the distal body of the   pancreas. No drainable loculated fluid collections are appreciated. The   pancreas has lost areas of its normal undulating border. There is nodular   thickening of the mesenteric fat within the central abdomen. Prominent   lymph nodes are identified inthe region of the porta hepatis with small   lymph nodes in the retroperitoneal periaortic region. There is no   evidence of an abdominal aortic aneurysm. There is no evidence of bowel   obstruction. There are findings which may represent bowel wall thickening   within the proximal duodenum.    Evaluation of the pelvis demonstrates no evidence of free fluid,     loculated fluid collections, masses nor pathologic size adenopathy.    IMPRESSION:      1. Findings raising concern of pancreatitis. Correlation with pancreatic   function test is recommended. There are also findings which may reflect a   concomitant enteritis likely reactive.  2. More ominous etiologies cannot be excluded particularly if there are   no clinical or laboratory findingsconsistent with pancreatitis. In the   absence of clinical and/or laboratory findings of pancreatitis further   evaluation with endoscopic ultrasound is recommended.  3. The nodal findings at this time like represent reactive lymph nodes.   The findings within the mesentery alternatively may represent   lymphangitic spread of an inflammatory or possibly even neoplastic   process if clinically warranted.  Thank you for the opportunity to contribute to the care of your patient.         Verified BD:[id number] Johnnette Barrios, M.D., MD   Assessment/Plan:  Assessment/Plan:  Assessment 1) pancreatitis, uncertain etiology.   dilated bile duct noted.  Patinet had continued pain through yesterday, oral contrasted ct today showing prominant lymph nodes and evidence of pancreatitis but no other lesions.  patient denied nausea  and less reported abdominal pain today this am, currently sedated.   Plan 1) will advance diet ot full liquids, will need to have outpatient follow up, repeat ct in 3-4 months.   Electronic Signatures: Loistine Simas (MD)  (Signed 05-Mar-14 16:35)  Authored: Chief Complaint, VITAL SIGNS/ANCILLARY NOTES, Brief Assessment, Lab Results, Radiology Results, Assessment/Plan   Last Updated: 05-Mar-14 16:35 by Loistine Simas (MD)

## 2014-06-04 NOTE — Consult Note (Signed)
Brief Consult Note: Diagnosis: acute recurrent pancreatitis.   Patient was seen by consultant.   Consult note dictated.   Recommend further assessment or treatment.   Orders entered.   Comments: Please see full GI consult [phone]#[id number].  Patient admitted with epigastric, left and right upper abdominal pain.  evaluation c/w pancreatitis, etiology uncertain.  Patietn denies previous CCY, however us is post CCY, with some increast of CBD dimension from 2007  to now of 10.0 to 12.5 mm.  No evidence of cbd lesion on us.  Agree with MRCP.  Continue current, will recheck lipase this evening.  History of ckd and chf may preclude more agressive hydration than at present.   Following.  Electronic Signatures: Barnetta ChapelSkulskie, Martin (MD)  (Signed 02-Mar-14 18:10)  Authored: Brief Consult Note   Last Updated: 02-Mar-14 18:10 by Barnetta ChapelSkulskie, Martin (MD)

## 2014-06-04 NOTE — Consult Note (Signed)
Chief Complaint:  Subjective/Chief Complaint seen for pancreatitis-continues with abdominal pain and distension, but complaining that he wants to eat.  passing flatus.   VITAL SIGNS/ANCILLARY NOTES: **Vital Signs.:   03-Mar-14 13:15  Vital Signs Type Routine  Temperature Temperature (F) 97.7  Celsius 36.5  Temperature Source oral  Pulse Pulse 90  Respirations Respirations 18  Systolic BP Systolic BP 025  Diastolic BP (mmHg) Diastolic BP (mmHg) 75  Mean BP 109  Pulse Ox % Pulse Ox % 91  Pulse Ox Activity Level  At rest  Oxygen Delivery 2L   Brief Assessment:  Cardiac Irregular   Respiratory clear BS   Gastrointestinal details normal No rebound tenderness  mild to moderate distension, obese, bs poor, positive tenderness generally, mostly epigsstric and ruq   Lab Results: Routine Chem:  03-Mar-14 04:46   Lipase  459 (Result(s) reported on 14 Apr 2012 at 05:11AM.)  Glucose, Serum  146  BUN  53  Creatinine (comp)  2.91  Sodium, Serum  132  Potassium, Serum 4.5  Chloride, Serum  96  CO2, Serum 26  Calcium (Total), Serum 9.1  Anion Gap 10  Osmolality (calc) 282  eGFR (African American)  25  eGFR (Non-African American)  21 (eGFR values <51m/min/1.73 m2 may be an indication of chronic kidney disease (CKD). Calculated eGFR is useful in patients with stable renal function. The eGFR calculation will not be reliable in acutely ill patients when serum creatinine is changing rapidly. It is not useful in  patients on dialysis. The eGFR calculation may not be applicable to patients at the low and high extremes of body sizes, pregnant women, and vegetarians.)  Routine Hem:  03-Mar-14 04:46   WBC (CBC)  15.7  RBC (CBC)  4.20  Hemoglobin (CBC)  12.2  Hematocrit (CBC)  37.9  Platelet Count (CBC) 214  MCV 90  MCH 29.1  MCHC 32.3  RDW  15.9  Neutrophil % 87.8  Lymphocyte % 4.5  Monocyte % 6.8  Eosinophil % 0.7  Basophil % 0.2  Neutrophil #  13.8  Lymphocyte #  0.7   Monocyte #  1.1  Eosinophil # 0.1  Basophil # 0.0 (Result(s) reported on 14 Apr 2012 at 05:15AM.)   Assessment/Plan:  Assessment/Plan:  Assessment 1) acute pancreatitis, recurrent.  unable to have MRCP done today due to size limitation of scanner.  Next recommendation is CT abd with pancreatic protocol.  Currently creatinine is too high to do this, continue to hydrate, await improvement of creatinine. Will allow ice chips, reevaluate tomorrow.   Plan as above.   Electronic Signatures: SLoistine Simas(MD)  (Signed 03-Mar-14 22:16)  Authored: Chief Complaint, VITAL SIGNS/ANCILLARY NOTES, Brief Assessment, Lab Results, Assessment/Plan   Last Updated: 03-Mar-14 22:16 by SLoistine Simas(MD)

## 2014-06-04 NOTE — H&P (Signed)
PATIENT NAME:  Zachary Douglas, Zachary Douglas MR#:  [id number] DATE OF BIRTH:  06/06/1946  DATE OF ADMISSION:  04/13/2012  REFERRING PHYSICIAN:  Dr. Mindi JunkerGottlieb.  PRIMARY CARE PHYSICIAN:  Dr. Toy CookeyErnest Eason.   CHIEF COMPLAINT:  Abdominal pain.   HISTORY OF PRESENT ILLNESS:  This is a 64 year old male with significant past medical history of hypertension, hyperlipidemia, type 2 diabetes, known history of diuretic-induced pancreatitis in 2002, presents with complaints of abdominal pain, the patient reports he is having abdominal pain for the last 2 weeks, reports some mild nausea, but no vomiting, denies fever, chills, diarrhea, swelling, chest pain, complains of some loss of appetite, denies any weight gain or weight loss, the patient was found to be in acute renal failure with a creatinine of more than 3 from baseline of 1.49, and with elevated lipase, the patient's LFTs were within normal limits.  He was not jaundiced.  Denies any jaundice, had ultrasound done which did show CBL of 12.4, as well it did show increased echogenicity in the pancreas which usually seen in pancreatitis, the gallbladder could not be viewed, the patient had leukocytosis of 14,000.  Denies any fever, chills, but he was complaining of polyuria, going multiple times to the bathroom, the patient's ultrasound did not show any acute finding in his kidneys.   Hospitalist service were requested to admit the patient for further management and work-up of his acute pancreatitis and acute renal failure.   PAST MEDICAL HISTORY:  1.  Hypertension.  2.  Diabetes mellitus type 2.  3.  Hypercholesterolemia.  4.  Chronic respiratory failure, on continuous oxygen at home.  5.  COPD.  6.  Congestive heart failure.  7.  Obstructive sleep apnea.  8.  Former tobacco abuse.  9.  Glaucoma with left eye blindness.  10.  History of cardiomegaly.  11.  History of hospitalization 2002 related to diuretic-induced pancreatitis.   PAST SURGICAL HISTORY:  1.  Total  knee replacement.  2.  Several back operations.  3.  Left eye surgery.  4.  Total right hip replacement in 1999.  5.  Appendectomy.   ALLERGIES:  MOTRIN.   HOME MEDICATIONS: 1.  Etodolac 500 mg oral 2 times a day.  2.  Isosorbide mononitrate 30 mg oral daily.  3.  Janumet XR 1000/50 mg oral every evening.  4.  Januvia 100 mg oral daily.  5.  Crestor 20 mg oral at bedtime.  6.  Exforge 5/320 mg oral daily.  7.  Amantadine 100 mg oral 2 times a day.  8.  Alprazolam 0.25 mg as needed for anxiety.  9.  Advair Diskus 250/50 1 puff 2 times a day.  10.  Lasix 40 mg oral 2 times a day.  11.  Ranitidine 150 mg oral daily.  12.  Travatan ophthalmic solution one drop both eyes at evening.   FAMILY HISTORY:  Significant for brain tumor in his father and COPD in the mother.   SOCIAL HISTORY:  Currently nonsmoker.  He quit smoking 16 years ago.  Denies any alcohol use.  Denies any illicit drug use.   REVIEW OF SYSTEMS:  CONSTITUTIONAL:  Denies fever, chills, fatigue, weakness.  EYES:  Denies blurry vision, double vision, pain, inflammation.  Has left eye blindness and has history of glaucoma.  EARS, NOSE, THROAT:  Denies tinnitus, ear pain, hearing loss, epistaxis or discharge.  RESPIRATORY:  Denies cough, wheezing, hemoptysis.  Has known history of COPD chronically on home oxygen.  CARDIOVASCULAR:  Denies chest pain,  arrhythmia, palpitations, syncope.  Has chronic lower extremity edema. GASTROINTESTINAL:  Mild nausea.  Denies vomiting, diarrhea, constipation, hematemesis, melena, rectal bleed.  Has abdominal pain in the epigastric area for two weeks.  GENITOURINARY:  Complains of polyuria.  Denies dysuria, hematuria.  ENDOCRINE:  Denies nocturia.  Complains of polyuria.  Denies any thyroid problems, heat or cold intolerance.  HEMATOLOGY:  Denies anemia, easy bruising, bleeding diathesis.  INTEGUMENT:  Denies acne, rash or skin lesions.  MUSCULOSKELETAL:  Denies gout, cramps.  Ambulates with a  cane.  NEUROLOGICAL:  Denies numbness, dysarthria, epilepsy, tremors, vertigo, CVA or seizures.  PSYCHIATRIC:  Has mild anxiety.  No schizophrenia, nervousness, insomnia, substance or alcohol abuse.   PHYSICAL EXAMINATION: VITAL SIGNS:  Pulse 79, respiratory rate 18, blood pressure 127/61, saturating 99% on oxygen.  GENERAL:  Morbidly obese male, looks comfortable lying on bed in no apparent distress.  HEENT:  Head atraumatic, normocephalic.  Left pupil irregular, nonreactive.  Right pupil round, reactive to light and accommodation.  Extraocular muscle intact.  Anicteric sclerae.  Pink conjunctivae.  No discharge from the nares.  Oral mucosa moist.  NECK:  Supple.  Full range of motion.  No JVD.  No carotid bruits.  No thyromegaly or tenderness to palpation over the thyroid area.   CHEST:  The patient had good air entry bilaterally with no use of accessory respiratory muscles, the patient had some mild bibasilar rales, but no wheezing or rhonchi.  CARDIOVASCULAR:  S1, S2 heard.  Regular rate and rhythm.  No murmurs, rubs or gallops.  ABDOMEN:  Morbidly obese, nondistended.  Bowel sounds present.  No rebound or guarding, had tenderness to palpation on abdomen diffusely, but more pronounced in the epigastric area.  EXTREMITIES:  +2 pitting edema.  No clubbing.  No cyanosis.  SKIN:  Normal skin turgor.  Warm and dry.  PSYCHIATRIC:  Appropriate affect, pleasant, awake, alert x 3.  Intact judgment and insight.  NEUROLOGIC:  Cranial nerves grossly intact.  No focal deficits.  Motor 5 out of 5.   PERTINENT LABORATORY DATA:  Glucose 145, BUN 55, creatinine 3.09, sodium 127, potassium 3.8, chloride 90, CO2 30, ALT 26, AST 26, alkaline phosphatase 113.  Troponin less than 0.02.  White blood cells 14.7, hemoglobin 10.7, hematocrit 41.2, platelets 198.  Ultrasound abdomen, increased echogenicity on the pancreas which can be seen in pancreatitis and dilated common bile duct and ultrasound renal was normal.    ASSESSMENT AND PLAN: 1.  Acute pancreatitis/abdominal pain.  The patient has elevated lipase level which goes with his acute pancreatitis, etiology at this point is unclear, but he has very mild nausea, no vomiting, these symptoms have been going on for two weeks, ultrasound of the abdomen showing dilated CBL with increased echogenicity in the pancreas, unfortunately he is in acute renal failure and cannot do CT of abdomen with IV contrast, so we will check MRCP, as well we will request to evaluate for the pancreas to rule out any mass in the pancreas causing obstruction of the CBL, we will consult GI service, we will keep patient on clear liquid diet as he is able to tolerate by mouth  intake, the patient is known to have history of diuretic-induced pancreatitis in the past, but this is unlikely the cause here, especially with his dilated CBL and echogenicity on the pancreas, we will recheck lipase in a.m., we will check lipid panel as well.  2.  Acute renal failure.  We will continue to hold Lasix and  exforge , we will check urinalysis, bladder scan done in ED showing 198 mL, but this was not post void, so we will keep close eye to be sure obstructive uropathy is not the cause of his renal failure, but there is no hydronephrosis on the ultrasound, we will consult nephrology service, especially with his hyponatremia.  3.  Hyponatremia, most likely induced by his Lasix, we will check urine sodium, we will continue with normal saline and we will recheck sodium level in a.m.  4.  Leukocytosis.  The patient has no fever, we will hold on antibiotics.  We will check urinalysis.  We will check CBC in a.m. 5.  Hypertension.  Acceptable.  Continue home medication, but hold valsartan.  6.  Hypercholesterolemia.  Check lipid panel.  Continue with Crestor.  7.  Type 2 diabetes.  Hold oral medications.  Continue with insulin sliding scale.  8.  Chronic respiratory failure.  We will continue patient on his home  oxygen.  9.  Chronic obstructive pulmonary disease.  No wheezing.  Does not appear to be in exacerbation.  We will keep him on Advair, and we will have him on as needed albuterol.  No indication for steroids at this point.  10.  History of congestive heart failure, dilated, does not appear to be in acute exacerbation, compensated, we will continue with gentle hydration.  Due to his renal failure we will hold Lasix.  11.  Glaucoma.  Continue with eye drops.  12.  Deep vein thrombosis prophylaxis.  SubQ heparin.  13.  GI prophylaxis.  On famotidine.   CODE STATUS:  FULL CODE.   TOTAL TIME SPENT ON ADMISSION AND PATIENT CARE:  60 minutes.    ____________________________ Starleen Arms, MD dse:ea D: 04/13/2012 01:03:58 ET T: 04/13/2012 02:41:34 ET JOB#: [id number]  cc: Starleen Arms, MD, <Dictator> DAWOOD Teena Irani MD ELECTRONICALLY SIGNED 04/14/2012 1:30

## 2014-06-06 NOTE — Discharge Summary (Signed)
PATIENT NAME:  Zachary Douglas, Zachary Douglas MR#:  [id number] DATE OF BIRTH:  06/06/1946  DATE OF ADMISSION:  04/18/2011 DATE OF DISCHARGE:  04/22/2011  PRESENTING COMPLAINT: Shortness of breath and cough with leg pain.   DISCHARGE DIAGNOSES:  1. Acute on chronic mild diastolic heart failure.  2. Untreated severe sleep apnea.  3. Hypertension.  4. Morbid obesity.  5. Acute bronchitis/pneumonitis.  6. Type 2 diabetes.  7. Dietary noncompliance.   CONDITION ON DISCHARGE: Fair. Vitals stable. Saturations are 91% to 94% on 2 liters nasal cannula oxygen continuous.   REFERRALS:   1. Home health physical therapy and R.N.  2. Rolling walker.   MEDICATIONS:  1. Crestor 10 mg daily.  2. Lasix 40 mg p.o. b.i.d.  3. Toprol-XL 25 mg daily.  4. DuoNebs as needed.  5. Januvia 50 mg p.o. b.i.d.  6. Advair 250/50, 1 puff b.i.d.  7. Imdur 30 mg p.o. extended release daily.  8. Xanax 0.25 p.o. daily as needed.  9. AcipHex 20 mg daily.  10. Aspirin 81 mg daily.  11. Combigan 0.2 to 0.5 ophthalmic solution one drop in each eye b.i.d.  12. Travatan Z 0.004% ophthalmic solution one drop to each eye at supper time.  13. Patient advised to stop Janumet.  FOLLOW UP: Follow up with Dr. Maryellen PileEason 05/08/2011 at 10:00 a.m.   LABORATORY, DIAGNOSTIC AND RADIOLOGICAL DATA: Echo Doppler showed left ventricle is moderately dilated. There is no thrombus. Left ventricular systolic function is moderately reduced. Ejection fraction of 35% to 45%. There is mild global hypokinesis of the left ventricle. Right ventricular systolic function is normal. Borderline right and left atrial enlargement, mild to moderate tricuspid regurgitation, mild MR.   Hemoglobin and hematocrit 12.9 and 39.4, platelet count 121, white count 8.1, creatinine 1.45, BUN 20, sodium 139, potassium 4.4. Hemoglobin A1c 8.2. Lipid profile within normal limits. Cardiac enzymes x3 negative.   Ultrasound lower extremity: No evidence of deep vein thrombosis. Ultrasound  kidneys appears normal ultrasound kidneys. Urinalysis negative for urinary tract infection. Blood cultures no growth in 36 hours. B-type natriuretic peptide 139.   CONSULTATION: Physical therapy.   BRIEF SUMMARY OF HOSPITAL COURSE: Zachary Douglas is a 64 year old morbidly obese, weighs around 361 pounds, with multiple medical problems is admitted with:  1. Acute on chronic mild diastolic congestive heart failure, +/- pneumonitis with acute bronchitis superimposed on severe obstructive sleep apnea which remains untreated and chronic obstructive pulmonary. Patient was started initially on IV Lasix. He did not seem to be in florid heart failure clinically. His main symptom was mainly due to his untreated sleep apnea. He also has poor sedentary lifestyle and is extremely noncompliant with his diet per family members. He was continued on nitrates p.r.n., started on low dose beta blockers, completed a p.o. Z-Pak for acute bronchitis, continued on his breathing treatments and oxygen. Patient felt much better after above treatment. His echo showed ejection fraction of 30% to 35%. He has known cardiomyopathy. Dr. Juliann Paresallwood was curb sided and did not recommend any further interventions at this time but get his sleep apnea work-up done as outpatient.  2. Chest pain, appears atypical. Cardiac enzymes remained negative. Patient was kept on oxygen, nitrates, aspirin and beta blockers.  3. Renal insufficiency, mild chronic kidney disease. Creatinine 1.45. Nephrotoxins were avoided. Metformin was discontinued.   4. Type 2 diabetes. Patient is on Janumet 50 mg b.i.d. He is recommended on lifestyle changes with no concentrated sugars.  5. Obstructive sleep apnea. Patient advised to get formal  sleep study. Initially he refused it but, however, this time he says he will attempt to do. Will defer to Dr. Maryellen Pile for further work-up on the same.  6. Chronic obstructive pulmonary disease. Stable on home oxygen.  7. Generalized  deconditioning. Physical therapy saw patient. Initially patient was quite weak. Plans were made to discharge to rehab, however, over the weekend patient improved and he felt better and requested if he could give it a try at home hence physical therapy and home health R.N. and was set up care manager. Patient was also prescribed a rolling walker.  8. CODE STATUS: Patient remained a FULL CODE.   TIME SPENT: 40 minutes.  ____________________________ Wylie Hail Allena Katz, MD sap:cms D: 04/22/2011 12:06:18 ET T: 04/22/2011 12:26:37 ET JOB#: [id number]  cc: Jarvin Ogren A. Allena Katz, MD, <Dictator> Serita Sheller. Maryellen Pile, MD Willow Ora MD ELECTRONICALLY SIGNED 04/26/2011 16:04

## 2014-06-06 NOTE — H&P (Signed)
PATIENT NAME:  Zachary Douglas, Zachary Douglas MR#:  [id number] DATE OF BIRTH:  06/06/1946  DATE OF ADMISSION:  04/18/2011  ADDENDUM   MEDICATIONS: The patient's medication list has been confirmed and he takes the following as below.  1. Aspirin 81 mg daily. 2. Crestor 10 mg daily. 3. Azor 10/40 mg one tablet daily. 4. Janumet 50,000 mg twice a day. 5. Advair Diskus 250/50 one dose inhaled twice a day. 6. Lasix 80 mg daily.  7. Imdur 30 mg daily.  8. Xanax 0.25 mg p.r.n.  9. AcipHex 20 mg daily.  ____________________________ Elon AlasKamran N. Minnah Llamas, MD knl:slb D: 04/18/2011 16:18:29 ET T: 04/18/2011 16:38:47 ET JOB#: [id number]  cc: Elon AlasKamran N. Blimie Vaness, MD, <Dictator> Dwayne D. Juliann Paresallwood, MD Serita ShellerErnest B. Maryellen PileEason, MD Elon AlasKAMRAN N Omarion Minnehan MD ELECTRONICALLY SIGNED 04/25/2011 18:20

## 2014-06-06 NOTE — H&P (Signed)
PATIENT NAME:  Zachary Douglas, Zachary Douglas MR#:  [id number] DATE OF BIRTH:  06/06/1946  DATE OF ADMISSION:  04/18/2011  REFERRING PHYSICIAN:  Dr. Margarita Grizzle PRIMARY CARE PHYSICIAN:  Dr. Maryellen Pile CARDIOLOGIST:  Dr. Juliann Pares  CHIEF COMPLAINT: "My legs have been swelling and I have been short of breath."   HISTORY OF PRESENT ILLNESS:  The patient is a pleasant 64 year old male with past medical history listed below including obesity, hypertension, hypercholesterolemia, diabetes mellitus, chronic respiratory failure, obstructive sleep apnea, congestive heart failure, cardiomyopathy, and chronic obstructive pulmonary disease who presents to the Emergency Department with the above-mentioned chief complaint. The patient states that he has had worsening lower extremity swelling from the ankle up to his knees over the past 5 to 7 days. He also reports shortness of breath over the past 2 to 3 weeks, which has been worsening. Shortness of breath is associated with cough productive of yellowish-greenish sputum and phlegm production. He reports chills as well but denies fevers. He also states that over the past week he has had some chest pain substernally located, nonradiating, which comes on mostly with walking around, lasts 2-3 seconds, and thereafter eases off with rest. He does admit to increased salt consumption lately. He also reports orthopnea and has been sleeping in his chair. He also has dyspnea on exertion, although states that he usually does not exert himself very much and is not very active. He went to see Dr. Juliann Pares this morning and was advised to come to the ER for further evaluation. In the ER he was noted to have negative troponin, BNP of 139. Chest x-ray revealing slightly increased lung markings in the right lung base suspicious for atelectasis versus developing infiltrate. On exam he has a few rales at the lung bases bilaterally and also has significant bilateral lower extremity pitting edema. He0 reports compliance  with Lasix at home and states this was recently increased to 80 mg per day. Otherwise he is without specific complaints. Hospitalist services were contacted for further evaluation and for hospital admission.   PAST SURGICAL HISTORY: 1. Total knee surgery. 2. Seven back operations starting in 1988. 3. Left eye.  4. Total right hip replacement 1999. 5. Appendectomy.   PAST MEDICAL HISTORY:  1. Hypertension.  2. Hypercholesterolemia. 3. Type 2 diabetes mellitus.  4. Chronic respiratory failure. The patient is on continuous oxygen at home. 5. Chronic obstructive pulmonary disease. 6. Congestive heart failure with normal ejection fraction at 70% per LexiScan 05/12/2010 with normal wall motion at that time.  7. Obstructive sleep apnea.  8. Former tobacco abuse.  9. Glaucoma with left eye blindness. 10. History of  cardiomyopathy.  11. History of hospitalization in 2002 for problems related to diuretic-induced pancreatitis.  ALLERGIES: Motrin.   HOME MEDICATIONS:  Need to be verified. The patient states that Dr. Maryellen Pile is faxing over a list of the patient's medications from his office. The patient states that he is taking: 1. Lasix 80 mg per day.  2. Aspirin 81 mg per day. 3. A diabetic pill but not metformin as this was causing diarrhea. He states he is taking something else for diabetes.  4. The patient also states that he takes a cholesterol pill, also blood pressure pill, eyedrops for glaucoma,  Travatan and ComBgen. The patient is not aware of complete list. We are waiting for records to come from Dr. Jake Church office and also attempting to call the patient's pharmacy.   FAMILY HISTORY: Father died secondary to a brain tumor. Mother died secondary  to chronic obstructive pulmonary disease complications.  SOCIAL HISTORY:  Tobacco- none currently, smoked about 15 years and stopped over 15 years ago.  In the past he smoked 1-1/2 packs per day. Alcohol- occasional beer. Illicit drugs- none. The  patient lives at home by himself in [place], [place]. He has a sister and a niece who accompany him today.   REVIEW OF SYSTEMS: CONSTITUTIONAL: Reports chills. Denies fevers. Denies fatigue. Denies weakness.  Had some chest pain earlier which has since resolved. Reports mild weight gain recently and shortness of breath and orthopnea.  HEAD/EYES:  Denies headache or blurry vision. Denies headache. Has chronic vision loss in the left eye. ENT: Denies tinnitus, earache, nasal discharge, or sore throat. RESPIRATORY: Reports shortness of breath, cough. Denies wheezing, reports orthopnea. CARDIOVASCULAR:  Had some chest pain on and off, none currently, but reports orthopnea and lower extremity edema. Denies heart palpitations. GI: Denies nausea, vomiting, diarrhea, constipation, melena, hematochezia, or abdominal pains. GU: Denies dysuria or hematuria. ENDOCRINE: Denies heat or cold intolerance. HEME/LYMPH: Denies easy bruising or bleeding.  INTEGUMENT: Denies rashes or lesions. MUSCULOSKELETAL: Denies joint pain or muscle weakness currently. Does have chronic back pain, none currently. NEURO: Denies headache, numbness, weakness, tingling, or dysarthria. PSYCHIATRIC: Denies depression or anxiety.   PHYSICAL EXAMINATION:  VITAL SIGNS: Temperature 98.4, pulse 69,  blood pressure 141/74, respirations 20, oxygen saturation 94% on 2 liters nasal cannula.   GENERAL: Obese male, sitting upright on gurney, alert and oriented, not acutely distressed.   HEENT: Normocephalic, atraumatic. Left pupil is irregular and nonreactive. Right pupil is round and reactive to light and accommodation. Extraocular muscles are intact. Anicteric sclerae. Conjunctivae pink.  Hearing intact to voice. Nares without drainage. Oral mucosa moist without lesions.   NECK: Supple with full range of motion. No jugular venous distention, lymphadenopathy, or carotid bruits bilaterally. No thyromegaly or tenderness to palpation over the thyroid  gland.  CHEST:  Normal respiratory effort without use of accessory respiratory muscles.  The patient has decreased breath sounds bilaterally with few bibasilar rales. No crackles. No wheezing or rhonchi.   CARDIOVASCULAR: Distant S1, S2. Regular rate and rhythm. No murmurs, rubs, or gallops. Difficult to assess PMI given patient's body habitus.    ABDOMEN: Obese, soft, nontender, nondistended. Normoactive bowel sounds. No hepatosplenomegaly or palpable masses. No hernias.   EXTREMITIES: 2+ pitting edema of bilateral lower extremities without clubbing or cyanosis, erythema, or warmth. Pedal pulses are palpable bilaterally. He has edema from the ankles up to the knees bilaterally. Limbs are nontender to palpation bilaterally.   SKIN: No suspicious rashes. Skin turgor is good.   LYMPH:  No cervical lymphadenopathy.  NEURO:  The patient is alert and oriented times three.  Cranial nerves II-XII are grossly intact.  No focal deficits.  PSYCH: Pleasant male with appropriate affect.   LABORATORY, DIAGNOSTIC, AND RADIOLOGICAL DATA:  Chest x-ray PA and lateral reveals slightly increased lung markings at the right lung base, could be minimal right lung base atelectasis versus developing infiltrate.  EKG with normal sinus rhythm, first-degree AV block, heart rate 72 beats per minute with borderline left ventricular hypertrophy. He has some nonspecific ST and T wave changes. No acute ischemic changes noted.   CBC within normal limits except for platelet count slightly low at 126.   Complete metabolic panel: Sodium 138, potassium 4.2, chloride 98, bicarbonate 33, BUN 19, creatinine 1.39, glucose 171, calcium 9.3, anion gap 7. Total protein, albumin, total bilirubin, AST, ALT and alkaline phosphatase within  normal limits. Troponin less than 0.02.  BNP 139.   ASSESSMENT AND PLAN: This is a 64 year old male with past medical history of chronic respiratory failure, congestive heart failure, chronic  obstructive pulmonary disease, obstructive sleep apnea, hypertension, hypercholesterolemia, type 2 diabetes mellitus, former tobacco abuse, and cardiomyopathy, here with worsening shortness of breath, lower extremity edema, and orthopnea with excessive salt intake. He also has transient chest pain as well as chills. Here with:  1. Shortness of breath, lower extremity edema, and orthopnea:  Likely represents early congestive heart failure exacerbation, which is likely acute on chronic, plus/minus pneumonia superimposed on obstructive sleep apnea and chronic obstructive pulmonary disease. Recommend hospital admission for further evaluation and management.  We will admit the patient to the telemetry unit. We will keep him on supplemental oxygen via nasal cannula and titrate to keep oxygen sats greater than 90%. He is on continuous oxygen at home. We will start diuresis with IV Lasix and monitor ins and outs strictly as well as daily weights. Also provide nitrate therapy as well as beta blocker. Could also consider ACE inhibitor, but will hold for now given renal impairment.  Obtain 2-D echocardiogram. Cycle cardiac enzymes. Obtain cardiology consultation for further recommendations. We will also obtain blood cultures. In the event of  pneumonia start empiric IV antibiotics and monitor clinical response. Further work-up and management to follow depending on the patient's clinical course.  2. Chest pain:  The patient is currently chest pain free. First troponin is negative. There are nonspecific ST and T wave changes on EKG. He had a negative Myoview (LexiScan) last year. We will continue to cycle his cardiac enzymes. Obtain echocardiogram and cardiology consultation. In the meanwhile we will keep the patient on oxygen, aspirin, nitrate, and beta blocker therapy. We also need to confirm his home medications. Check lipid panel and A1c.  3.  Renal insufficiency: Not sure if patient has underlying chronic kidney disease.   There are no prior labs to compare within our system. Monitor ins and outs and renal function closely while patient is on IV Lasix. Avoid nephrotoxins. Need to carefully review his home medications. Monitor ins and outs strictly. Get a urinalysis as well as renal ultrasound. We will follow the patient's renal function closely with diuresis. Consider nephrology evaluation if the patient's renal function worsens or does not improve during this hospital course.  4. Hypertension: Need to confirm the patient's home blood pressure medications. For now he will be maintained on Lasix, metoprolol, and nitrate therapy as well as p.r.n. IV hydralazine and we will monitor blood pressure closely on this regimen.  5. Type 2 diabetes mellitus:  Need to verify the patient's home diabetic medications. He states that he was taken off metformin due to diarrhea. We will not use metformin now in any case given renal impairment. We will check hemoglobin A1c and keep the patient on ADA diet and sliding scale insulin for now and confirm his home diabetic medications.  6. Hypercholesterolemia:  Check a fasting lipid profile. Confirm home medication that he is taking.  Consider statin. Provide low fat, low cholesterol diet.  7. Obstructive sleep apnea: He is on continuous oxygen. We will write for nocturnal CPAP.  8. Chronic obstructive pulmonary disease: Stable and without acute exacerbation. We will write for p.r.n. bronchodilators and also start Advair and Spiriva. Continue supplemental oxygen via nasal cannula.  9. Deep vein thrombosis prophylaxis: Subcutaneous heparin.  10. CODE STATUS: FULL CODE.  TIME SPENT ON THIS ADMISSION: Approximately 60  minutes.  ____________________________ Elon Alas, MD [id number] D:  04/18/2011 16:02:09 ET          T: 04/18/2011 16:43:30 ET        JOB#: [id number]  cc: Gerda Diss D. Juliann Pares, MD Serita Sheller Maryellen Pile, MD Elon Alas MD ELECTRONICALLY SIGNED 04/25/2011 18:22
